# Patient Record
Sex: Female | Born: 1970 | Race: White | Hispanic: No | Marital: Married | Smoking: Never smoker
Health system: Southern US, Community
[De-identification: ages and names within clinical notes are randomized; demographics above are authoritative.]

## PROBLEM LIST (undated history)

## (undated) DIAGNOSIS — D497 Neoplasm of unspecified behavior of endocrine glands and other parts of nervous system: Secondary | ICD-10-CM

## (undated) DIAGNOSIS — E039 Hypothyroidism, unspecified: Secondary | ICD-10-CM

## (undated) DIAGNOSIS — G7000 Myasthenia gravis without (acute) exacerbation: Secondary | ICD-10-CM

---

## 2011-06-17 ENCOUNTER — Other Ambulatory Visit: Payer: Self-pay | Admitting: Neurology

## 2011-06-17 DIAGNOSIS — G7000 Myasthenia gravis without (acute) exacerbation: Secondary | ICD-10-CM

## 2011-06-25 ENCOUNTER — Other Ambulatory Visit: Payer: Self-pay | Admitting: Neurology

## 2011-06-25 DIAGNOSIS — I728 Aneurysm of other specified arteries: Secondary | ICD-10-CM

## 2011-07-03 ENCOUNTER — Inpatient Hospital Stay
Admission: RE | Admit: 2011-07-03 | Discharge: 2011-07-03 | Payer: Self-pay | Source: Ambulatory Visit | Attending: Neurology | Admitting: Neurology

## 2017-04-14 ENCOUNTER — Encounter: Payer: Self-pay | Admitting: Gastroenterology

## 2019-04-01 DIAGNOSIS — R600 Localized edema: Secondary | ICD-10-CM

## 2019-09-30 ENCOUNTER — Emergency Department (HOSPITAL_COMMUNITY): Payer: Commercial Managed Care - PPO

## 2019-09-30 ENCOUNTER — Inpatient Hospital Stay (HOSPITAL_COMMUNITY)
Admission: EM | Admit: 2019-09-30 | Discharge: 2019-10-15 | DRG: 177 | Disposition: E | Payer: Commercial Managed Care - PPO | Source: Ambulatory Visit | Attending: Internal Medicine | Admitting: Internal Medicine

## 2019-09-30 ENCOUNTER — Encounter (HOSPITAL_COMMUNITY): Payer: Self-pay

## 2019-09-30 ENCOUNTER — Inpatient Hospital Stay (HOSPITAL_COMMUNITY): Payer: Commercial Managed Care - PPO

## 2019-09-30 DIAGNOSIS — J80 Acute respiratory distress syndrome: Secondary | ICD-10-CM | POA: Diagnosis present

## 2019-09-30 DIAGNOSIS — R197 Diarrhea, unspecified: Secondary | ICD-10-CM | POA: Diagnosis present

## 2019-09-30 DIAGNOSIS — E890 Postprocedural hypothyroidism: Secondary | ICD-10-CM | POA: Diagnosis present

## 2019-09-30 DIAGNOSIS — Z87892 Personal history of anaphylaxis: Secondary | ICD-10-CM | POA: Diagnosis not present

## 2019-09-30 DIAGNOSIS — J1282 Pneumonia due to coronavirus disease 2019: Secondary | ICD-10-CM | POA: Diagnosis present

## 2019-09-30 DIAGNOSIS — L89899 Pressure ulcer of other site, unspecified stage: Secondary | ICD-10-CM | POA: Diagnosis not present

## 2019-09-30 DIAGNOSIS — T380X5A Adverse effect of glucocorticoids and synthetic analogues, initial encounter: Secondary | ICD-10-CM | POA: Diagnosis present

## 2019-09-30 DIAGNOSIS — K449 Diaphragmatic hernia without obstruction or gangrene: Secondary | ICD-10-CM | POA: Diagnosis present

## 2019-09-30 DIAGNOSIS — I959 Hypotension, unspecified: Secondary | ICD-10-CM | POA: Diagnosis present

## 2019-09-30 DIAGNOSIS — R7989 Other specified abnormal findings of blood chemistry: Secondary | ICD-10-CM | POA: Insufficient documentation

## 2019-09-30 DIAGNOSIS — Z66 Do not resuscitate: Secondary | ICD-10-CM | POA: Diagnosis not present

## 2019-09-30 DIAGNOSIS — I82452 Acute embolism and thrombosis of left peroneal vein: Secondary | ICD-10-CM | POA: Diagnosis present

## 2019-09-30 DIAGNOSIS — R040 Epistaxis: Secondary | ICD-10-CM | POA: Diagnosis present

## 2019-09-30 DIAGNOSIS — I10 Essential (primary) hypertension: Secondary | ICD-10-CM | POA: Diagnosis present

## 2019-09-30 DIAGNOSIS — Z7982 Long term (current) use of aspirin: Secondary | ICD-10-CM

## 2019-09-30 DIAGNOSIS — Z6841 Body Mass Index (BMI) 40.0 and over, adult: Secondary | ICD-10-CM | POA: Diagnosis not present

## 2019-09-30 DIAGNOSIS — J9383 Other pneumothorax: Secondary | ICD-10-CM | POA: Diagnosis not present

## 2019-09-30 DIAGNOSIS — E039 Hypothyroidism, unspecified: Secondary | ICD-10-CM | POA: Diagnosis not present

## 2019-09-30 DIAGNOSIS — R0902 Hypoxemia: Secondary | ICD-10-CM | POA: Diagnosis present

## 2019-09-30 DIAGNOSIS — M7989 Other specified soft tissue disorders: Secondary | ICD-10-CM | POA: Diagnosis not present

## 2019-09-30 DIAGNOSIS — Z515 Encounter for palliative care: Secondary | ICD-10-CM | POA: Diagnosis not present

## 2019-09-30 DIAGNOSIS — Z7189 Other specified counseling: Secondary | ICD-10-CM | POA: Diagnosis not present

## 2019-09-30 DIAGNOSIS — Z887 Allergy status to serum and vaccine status: Secondary | ICD-10-CM | POA: Diagnosis not present

## 2019-09-30 DIAGNOSIS — R609 Edema, unspecified: Secondary | ICD-10-CM | POA: Diagnosis not present

## 2019-09-30 DIAGNOSIS — Z85850 Personal history of malignant neoplasm of thyroid: Secondary | ICD-10-CM | POA: Diagnosis not present

## 2019-09-30 DIAGNOSIS — J9601 Acute respiratory failure with hypoxia: Secondary | ICD-10-CM | POA: Diagnosis not present

## 2019-09-30 DIAGNOSIS — I82441 Acute embolism and thrombosis of right tibial vein: Secondary | ICD-10-CM | POA: Diagnosis present

## 2019-09-30 DIAGNOSIS — R739 Hyperglycemia, unspecified: Secondary | ICD-10-CM | POA: Diagnosis not present

## 2019-09-30 DIAGNOSIS — Z79899 Other long term (current) drug therapy: Secondary | ICD-10-CM

## 2019-09-30 DIAGNOSIS — K219 Gastro-esophageal reflux disease without esophagitis: Secondary | ICD-10-CM | POA: Diagnosis present

## 2019-09-30 DIAGNOSIS — J982 Interstitial emphysema: Secondary | ICD-10-CM

## 2019-09-30 DIAGNOSIS — F419 Anxiety disorder, unspecified: Secondary | ICD-10-CM | POA: Diagnosis present

## 2019-09-30 DIAGNOSIS — E559 Vitamin D deficiency, unspecified: Secondary | ICD-10-CM | POA: Diagnosis present

## 2019-09-30 DIAGNOSIS — R6520 Severe sepsis without septic shock: Secondary | ICD-10-CM

## 2019-09-30 DIAGNOSIS — A419 Sepsis, unspecified organism: Secondary | ICD-10-CM | POA: Diagnosis not present

## 2019-09-30 DIAGNOSIS — K7200 Acute and subacute hepatic failure without coma: Secondary | ICD-10-CM | POA: Diagnosis not present

## 2019-09-30 DIAGNOSIS — R7303 Prediabetes: Secondary | ICD-10-CM | POA: Diagnosis present

## 2019-09-30 DIAGNOSIS — Z79890 Hormone replacement therapy (postmenopausal): Secondary | ICD-10-CM

## 2019-09-30 DIAGNOSIS — U071 COVID-19: Principal | ICD-10-CM

## 2019-09-30 DIAGNOSIS — I824Y3 Acute embolism and thrombosis of unspecified deep veins of proximal lower extremity, bilateral: Secondary | ICD-10-CM | POA: Diagnosis not present

## 2019-09-30 DIAGNOSIS — M79609 Pain in unspecified limb: Secondary | ICD-10-CM | POA: Diagnosis not present

## 2019-09-30 DIAGNOSIS — R7401 Elevation of levels of liver transaminase levels: Secondary | ICD-10-CM | POA: Diagnosis present

## 2019-09-30 DIAGNOSIS — G7000 Myasthenia gravis without (acute) exacerbation: Secondary | ICD-10-CM | POA: Diagnosis present

## 2019-09-30 DIAGNOSIS — I82403 Acute embolism and thrombosis of unspecified deep veins of lower extremity, bilateral: Secondary | ICD-10-CM

## 2019-09-30 DIAGNOSIS — R451 Restlessness and agitation: Secondary | ICD-10-CM | POA: Diagnosis not present

## 2019-09-30 HISTORY — DX: Myasthenia gravis without (acute) exacerbation: G70.00

## 2019-09-30 HISTORY — DX: Neoplasm of unspecified behavior of endocrine glands and other parts of nervous system: D49.7

## 2019-09-30 HISTORY — DX: Hypothyroidism, unspecified: E03.9

## 2019-09-30 LAB — CBG MONITORING, ED: Glucose-Capillary: 211 mg/dL — ABNORMAL HIGH (ref 70–99)

## 2019-09-30 LAB — LACTIC ACID, PLASMA
Lactic Acid, Venous: 1.1 mmol/L (ref 0.5–1.9)
Lactic Acid, Venous: 1.3 mmol/L (ref 0.5–1.9)

## 2019-09-30 LAB — CBC WITH DIFFERENTIAL/PLATELET
Abs Immature Granulocytes: 0.01 10*3/uL (ref 0.00–0.07)
Basophils Absolute: 0 10*3/uL (ref 0.0–0.1)
Basophils Relative: 0 %
Eosinophils Absolute: 0 10*3/uL (ref 0.0–0.5)
Eosinophils Relative: 0 %
HCT: 44.2 % (ref 36.0–46.0)
Hemoglobin: 14.3 g/dL (ref 12.0–15.0)
Immature Granulocytes: 0 %
Lymphocytes Relative: 19 %
Lymphs Abs: 0.5 10*3/uL — ABNORMAL LOW (ref 0.7–4.0)
MCH: 27.9 pg (ref 26.0–34.0)
MCHC: 32.4 g/dL (ref 30.0–36.0)
MCV: 86.2 fL (ref 80.0–100.0)
Monocytes Absolute: 0.2 10*3/uL (ref 0.1–1.0)
Monocytes Relative: 9 %
Neutro Abs: 2 10*3/uL (ref 1.7–7.7)
Neutrophils Relative %: 72 %
Platelets: 132 10*3/uL — ABNORMAL LOW (ref 150–400)
RBC: 5.13 MIL/uL — ABNORMAL HIGH (ref 3.87–5.11)
RDW: 13.5 % (ref 11.5–15.5)
WBC: 2.7 10*3/uL — ABNORMAL LOW (ref 4.0–10.5)
nRBC: 0 % (ref 0.0–0.2)

## 2019-09-30 LAB — COMPREHENSIVE METABOLIC PANEL
ALT: 172 U/L — ABNORMAL HIGH (ref 0–44)
AST: 235 U/L — ABNORMAL HIGH (ref 15–41)
Albumin: 3.1 g/dL — ABNORMAL LOW (ref 3.5–5.0)
Alkaline Phosphatase: 70 U/L (ref 38–126)
Anion gap: 10 (ref 5–15)
BUN: 7 mg/dL (ref 6–20)
CO2: 24 mmol/L (ref 22–32)
Calcium: 7.5 mg/dL — ABNORMAL LOW (ref 8.9–10.3)
Chloride: 104 mmol/L (ref 98–111)
Creatinine, Ser: 0.78 mg/dL (ref 0.44–1.00)
GFR calc Af Amer: >60 mL/min (ref >60)
GFR calc non Af Amer: >60 mL/min (ref >60)
Glucose, Bld: 138 mg/dL — ABNORMAL HIGH (ref 70–99)
Potassium: 4.1 mmol/L (ref 3.5–5.1)
Sodium: 138 mmol/L (ref 135–145)
Total Bilirubin: 0.6 mg/dL (ref 0.3–1.2)
Total Protein: 6.6 g/dL (ref 6.5–8.1)

## 2019-09-30 LAB — PROCALCITONIN: Procalcitonin: <0.1 ng/mL

## 2019-09-30 LAB — I-STAT BETA HCG BLOOD, ED (MC, WL, AP ONLY): I-stat hCG, quantitative: <5 m[IU]/mL (ref <5)

## 2019-09-30 LAB — SARS CORONAVIRUS 2 BY RT PCR (HOSPITAL ORDER, PERFORMED IN ~~LOC~~ HOSPITAL LAB): SARS Coronavirus 2: POSITIVE — AB

## 2019-09-30 LAB — HIV ANTIBODY (ROUTINE TESTING W REFLEX): HIV Screen 4th Generation wRfx: NONREACTIVE

## 2019-09-30 LAB — D-DIMER, QUANTITATIVE: D-Dimer, Quant: 0.7 ug/mL-FEU — ABNORMAL HIGH (ref 0.00–0.50)

## 2019-09-30 LAB — C-REACTIVE PROTEIN: CRP: 9.2 mg/dL — ABNORMAL HIGH (ref <1.0)

## 2019-09-30 LAB — FERRITIN: Ferritin: 844 ng/mL — ABNORMAL HIGH (ref 11–307)

## 2019-09-30 LAB — TRIGLYCERIDES: Triglycerides: 64 mg/dL (ref <150)

## 2019-09-30 LAB — LACTATE DEHYDROGENASE: LDH: 595 U/L — ABNORMAL HIGH (ref 98–192)

## 2019-09-30 LAB — FIBRINOGEN: Fibrinogen: 498 mg/dL — ABNORMAL HIGH (ref 210–475)

## 2019-09-30 MED ORDER — MONTELUKAST SODIUM 10 MG PO TABS
10.0000 mg | ORAL_TABLET | Freq: Every day | ORAL | Status: DC
  Administered 2019-09-30 – 2019-10-09 (×10): 10 mg via ORAL
  Filled 2019-09-30 (×10): qty 1

## 2019-09-30 MED ORDER — SODIUM CHLORIDE 0.9 % IV BOLUS (SEPSIS)
1000.0000 mL | Freq: Once | INTRAVENOUS | Status: AC
  Administered 2019-09-30: 1000 mL via INTRAVENOUS

## 2019-09-30 MED ORDER — PANTOPRAZOLE SODIUM 40 MG PO TBEC
40.0000 mg | DELAYED_RELEASE_TABLET | Freq: Every day | ORAL | Status: DC
  Administered 2019-09-30 – 2019-10-09 (×10): 40 mg via ORAL
  Filled 2019-09-30 (×10): qty 1

## 2019-09-30 MED ORDER — PYRIDOSTIGMINE BROMIDE 60 MG PO TABS
60.0000 mg | ORAL_TABLET | Freq: Four times a day (QID) | ORAL | Status: DC
  Administered 2019-09-30 – 2019-10-09 (×37): 60 mg via ORAL
  Filled 2019-09-30 (×41): qty 1

## 2019-09-30 MED ORDER — IPRATROPIUM-ALBUTEROL 20-100 MCG/ACT IN AERS
1.0000 | INHALATION_SPRAY | Freq: Four times a day (QID) | RESPIRATORY_TRACT | Status: DC
  Administered 2019-10-01 – 2019-10-10 (×36): 1 via RESPIRATORY_TRACT
  Filled 2019-09-30: qty 4

## 2019-09-30 MED ORDER — IOHEXOL 350 MG/ML SOLN
100.0000 mL | Freq: Once | INTRAVENOUS | Status: AC | PRN
  Administered 2019-09-30: 100 mL via INTRAVENOUS

## 2019-09-30 MED ORDER — PREDNISONE 20 MG PO TABS: 50.0000 mg | ORAL_TABLET | Freq: Every day | ORAL | Status: DC

## 2019-09-30 MED ORDER — SODIUM CHLORIDE 0.9 % IV SOLN
100.0000 mg | Freq: Every day | INTRAVENOUS | Status: AC
  Administered 2019-10-01 – 2019-10-04 (×4): 100 mg via INTRAVENOUS
  Filled 2019-09-30 (×5): qty 20

## 2019-09-30 MED ORDER — LEVOTHYROXINE SODIUM 75 MCG PO TABS
175.0000 ug | ORAL_TABLET | Freq: Every day | ORAL | Status: DC
  Administered 2019-10-01 – 2019-10-09 (×9): 175 ug via ORAL
  Filled 2019-09-30 (×9): qty 1

## 2019-09-30 MED ORDER — ACETAMINOPHEN 325 MG PO TABS
650.0000 mg | ORAL_TABLET | Freq: Four times a day (QID) | ORAL | Status: DC | PRN
  Administered 2019-10-02 – 2019-10-05 (×2): 650 mg via ORAL
  Filled 2019-09-30 (×3): qty 2

## 2019-09-30 MED ORDER — INSULIN ASPART 100 UNIT/ML ~~LOC~~ SOLN
0.0000 [IU] | Freq: Three times a day (TID) | SUBCUTANEOUS | Status: DC
  Administered 2019-10-01 (×2): 4 [IU] via SUBCUTANEOUS
  Administered 2019-10-01: 7 [IU] via SUBCUTANEOUS
  Administered 2019-10-02: 3 [IU] via SUBCUTANEOUS
  Administered 2019-10-02 – 2019-10-03 (×3): 4 [IU] via SUBCUTANEOUS
  Administered 2019-10-03: 7 [IU] via SUBCUTANEOUS
  Administered 2019-10-04: 3 [IU] via SUBCUTANEOUS
  Administered 2019-10-04: 7 [IU] via SUBCUTANEOUS
  Administered 2019-10-04 – 2019-10-05 (×3): 3 [IU] via SUBCUTANEOUS
  Administered 2019-10-06 – 2019-10-07 (×3): 4 [IU] via SUBCUTANEOUS
  Administered 2019-10-07 (×2): 7 [IU] via SUBCUTANEOUS
  Administered 2019-10-08 (×2): 4 [IU] via SUBCUTANEOUS
  Administered 2019-10-08: 7 [IU] via SUBCUTANEOUS
  Administered 2019-10-09: 3 [IU] via SUBCUTANEOUS
  Administered 2019-10-09: 4 [IU] via SUBCUTANEOUS
  Administered 2019-10-10: 3 [IU] via SUBCUTANEOUS
  Administered 2019-10-10: 0 [IU] via SUBCUTANEOUS
  Administered 2019-10-10: 4 [IU] via SUBCUTANEOUS
  Administered 2019-10-11: 3 [IU] via SUBCUTANEOUS
  Administered 2019-10-11: 4 [IU] via SUBCUTANEOUS
  Filled 2019-09-30: qty 0.2

## 2019-09-30 MED ORDER — ONDANSETRON HCL 4 MG PO TABS: 4.0000 mg | ORAL_TABLET | Freq: Four times a day (QID) | ORAL | Status: DC | PRN

## 2019-09-30 MED ORDER — SODIUM CHLORIDE 0.9 % IV SOLN
INTRAVENOUS | Status: DC | PRN
  Administered 2019-09-30: 1000 mL via INTRAVENOUS

## 2019-09-30 MED ORDER — INSULIN DETEMIR 100 UNIT/ML ~~LOC~~ SOLN
0.1500 [IU]/kg | Freq: Two times a day (BID) | SUBCUTANEOUS | Status: DC
  Administered 2019-09-30 – 2019-10-05 (×10): 20 [IU] via SUBCUTANEOUS
  Filled 2019-09-30 (×11): qty 0.2

## 2019-09-30 MED ORDER — SODIUM CHLORIDE 0.9 % IV SOLN
200.0000 mg | Freq: Once | INTRAVENOUS | Status: AC
  Administered 2019-09-30: 200 mg via INTRAVENOUS
  Filled 2019-09-30: qty 200

## 2019-09-30 MED ORDER — ACETAMINOPHEN 325 MG PO TABS
650.0000 mg | ORAL_TABLET | Freq: Once | ORAL | Status: AC
  Administered 2019-09-30: 650 mg via ORAL
  Filled 2019-09-30: qty 2

## 2019-09-30 MED ORDER — METHYLPREDNISOLONE SODIUM SUCC 125 MG IJ SOLR
60.0000 mg | Freq: Two times a day (BID) | INTRAMUSCULAR | Status: DC
  Administered 2019-09-30: 60 mg via INTRAVENOUS
  Filled 2019-09-30: qty 2

## 2019-09-30 MED ORDER — SODIUM CHLORIDE 0.9 % IV BOLUS (SEPSIS)
200.0000 mL | Freq: Once | INTRAVENOUS | Status: AC
  Administered 2019-09-30: 200 mL via INTRAVENOUS

## 2019-09-30 MED ORDER — OXYBUTYNIN CHLORIDE ER 5 MG PO TB24
10.0000 mg | ORAL_TABLET | Freq: Every day | ORAL | Status: DC
  Administered 2019-09-30 – 2019-10-09 (×10): 10 mg via ORAL
  Filled 2019-09-30 (×6): qty 2
  Filled 2019-09-30 (×2): qty 1
  Filled 2019-09-30 (×2): qty 2

## 2019-09-30 MED ORDER — ASPIRIN EC 81 MG PO TBEC
81.0000 mg | DELAYED_RELEASE_TABLET | Freq: Every day | ORAL | Status: DC
  Administered 2019-09-30 – 2019-10-09 (×10): 81 mg via ORAL
  Filled 2019-09-30 (×10): qty 1

## 2019-09-30 MED ORDER — ONDANSETRON HCL 4 MG/2ML IJ SOLN
4.0000 mg | Freq: Four times a day (QID) | INTRAMUSCULAR | Status: DC | PRN
  Administered 2019-10-02: 4 mg via INTRAVENOUS
  Filled 2019-09-30: qty 2

## 2019-09-30 MED ORDER — METHYLPREDNISOLONE SODIUM SUCC 125 MG IJ SOLR
0.5000 mg/kg | Freq: Two times a day (BID) | INTRAMUSCULAR | Status: AC
  Administered 2019-09-30 – 2019-10-03 (×6): 68.125 mg via INTRAVENOUS
  Filled 2019-09-30 (×6): qty 2

## 2019-09-30 MED ORDER — LINAGLIPTIN 5 MG PO TABS
5.0000 mg | ORAL_TABLET | Freq: Every day | ORAL | Status: DC
  Administered 2019-09-30 – 2019-10-09 (×10): 5 mg via ORAL
  Filled 2019-09-30 (×10): qty 1

## 2019-09-30 MED ORDER — ENOXAPARIN SODIUM 60 MG/0.6ML ~~LOC~~ SOLN
60.0000 mg | SUBCUTANEOUS | Status: DC
  Administered 2019-09-30 – 2019-10-04 (×4): 60 mg via SUBCUTANEOUS
  Filled 2019-09-30 (×6): qty 0.6

## 2019-09-30 MED ORDER — SODIUM CHLORIDE 0.9 % IV SOLN: 200.0000 mg | Freq: Once | INTRAVENOUS | Status: DC

## 2019-09-30 MED ORDER — SODIUM CHLORIDE 0.9 % IV SOLN: 100.0000 mg | Freq: Every day | INTRAVENOUS | Status: DC

## 2019-09-30 MED ORDER — LIOTHYRONINE SODIUM 5 MCG PO TABS
5.0000 ug | ORAL_TABLET | Freq: Every day | ORAL | Status: DC
  Administered 2019-09-30 – 2019-10-09 (×10): 5 ug via ORAL
  Filled 2019-09-30 (×11): qty 1

## 2019-09-30 MED ORDER — GUAIFENESIN-DM 100-10 MG/5ML PO SYRP
10.0000 mL | ORAL_SOLUTION | ORAL | Status: DC | PRN
  Administered 2019-10-01 – 2019-10-08 (×24): 10 mL via ORAL
  Filled 2019-09-30 (×23): qty 10

## 2019-09-30 MED ORDER — ENOXAPARIN SODIUM 60 MG/0.6ML ~~LOC~~ SOLN: 60.0000 mg | SUBCUTANEOUS | Status: DC

## 2019-09-30 NOTE — ED Notes (Signed)
Pt sitting up in bed eating dinner.

## 2019-09-30 NOTE — Progress Notes (Signed)
Code sepsis dc'ed  

## 2019-09-30 NOTE — ED Provider Notes (Addendum)
[facility] DEPT Provider Note   CSN: [id number] Arrival date & time: 09/26/2019  1122     History Chief Complaint  Patient presents with  . Shortness of Breath    Diane Flowers is a 49 y.o. female.  Patient brought over to the emergency department from the infusion center.  Patient was scheduled to receive monoclonal antibody infusion today.  Patient had a positive Covid test on September 10.  Started to develop symptoms on September 9.  Patient did not receive the monoclonal antibody because her oxygen sats were around 86% on room air.  Patient states that she has had cough developed the diarrhea today.  Last night her oxygen saturations went down to 83% room air.  This is the 1st that they have dropped.  Patient is also had increased shortness of breath.        No past medical history on file.  There are no problems to display for this patient.      OB History   No obstetric history on file.     No family history on file.  Social History   Tobacco Use  . Smoking status: Not on file  Substance Use Topics  . Alcohol use: Not on file  . Drug use: Not on file    Home Medications Prior to Admission medications   Not on File    Allergies    Patient has no allergy information on record.  Review of Systems   Review of Systems  Constitutional: Negative for chills and fever.  HENT: Negative for rhinorrhea and sore throat.   Eyes: Negative for visual disturbance.  Respiratory: Positive for cough and shortness of breath.   Cardiovascular: Negative for chest pain and leg swelling.  Gastrointestinal: Positive for diarrhea. Negative for abdominal pain, blood in stool, nausea and vomiting.  Genitourinary: Negative for dysuria.  Musculoskeletal: Negative for back pain and neck pain.  Skin: Negative for rash.  Neurological: Negative for dizziness, light-headedness and headaches.  Hematological: Does not bruise/bleed easily.    Psychiatric/Behavioral: Negative for confusion.    Physical Exam Updated Vital Signs BP (!) 147/107   Pulse 100   Temp 98.8 F (37.1 C) (Oral)   Resp (!) 25   SpO2 91%   Physical Exam Vitals and nursing note reviewed.  Constitutional:      General: She is not in acute distress.    Appearance: She is well-developed. She is obese.  HENT:     Head: Normocephalic and atraumatic.  Eyes:     Extraocular Movements: Extraocular movements intact.     Conjunctiva/sclera: Conjunctivae normal.     Pupils: Pupils are equal, round, and reactive to light.  Cardiovascular:     Rate and Rhythm: Regular rhythm. Tachycardia present.     Heart sounds: No murmur heard.   Pulmonary:     Effort: Respiratory distress present.     Breath sounds: Normal breath sounds. No wheezing.     Comments: Tachypneic Abdominal:     Palpations: Abdomen is soft.     Tenderness: There is no abdominal tenderness.  Musculoskeletal:        General: Normal range of motion.     Cervical back: Normal range of motion and neck supple.  Skin:    General: Skin is warm and dry.     Capillary Refill: Capillary refill takes less than 2 seconds.  Neurological:     General: No focal deficit present.     Mental Status: She is  alert and oriented to person, place, and time.     Cranial Nerves: No cranial nerve deficit.     Sensory: No sensory deficit.     Motor: No weakness.     ED Results / Procedures / Treatments   Labs (all labs ordered are listed, but only abnormal results are displayed) Labs Reviewed  CULTURE, BLOOD (ROUTINE X 2)  CULTURE, BLOOD (ROUTINE X 2)  LACTIC ACID, PLASMA  LACTIC ACID, PLASMA  CBC WITH DIFFERENTIAL/PLATELET  COMPREHENSIVE METABOLIC PANEL  D-DIMER, QUANTITATIVE (NOT AT [facility])  PROCALCITONIN  LACTATE DEHYDROGENASE  FERRITIN  TRIGLYCERIDES  FIBRINOGEN  C-REACTIVE PROTEIN  I-STAT BETA HCG BLOOD, ED ([facility], [facility], [facility] ONLY)    EKG EKG Interpretation  Date/Time:  Thursday September 30 2019 12:26:32 EDT Ventricular Rate:  99 PR Interval:    QRS Duration: 111 QT Interval:  347 QTC Calculation: 446 R Axis:   -82 Text Interpretation: Sinus rhythm LAD, consider left anterior fascicular block Anteroseptal infarct, age indeterminate 52 Lead; Mason-Likar No previous ECGs available Confirmed by Fredia Sorrow [phone]) on 10/04/2019 12:43:31 PM   Radiology DG Chest Port 1 View  Result Date: 10/13/2019 CLINICAL DATA:  COVID, hypoxia, shortness of breath EXAM: PORTABLE CHEST 1 VIEW COMPARISON:  None. FINDINGS: Heart upper limits normal in size. Mediastinal contours within normal limits. Patchy bilateral airspace opacities. No effusions or pneumothorax. No acute bony abnormality. IMPRESSION: Patchy bilateral airspace disease compatible with COVID pneumonia. Electronically Signed   By: Rolm Baptise M.D.   On: 10/05/2019 12:33    Procedures Procedures (including critical care time) CRITICAL CARE Performed by: Fredia Sorrow Total critical care time: 45 minutes Critical care time was exclusive of separately billable procedures and treating other patients. Critical care was necessary to treat or prevent imminent or life-threatening deterioration. Critical care was time spent personally by me on the following activities: development of treatment plan with patient and/or surrogate as well as nursing, discussions with consultants, evaluation of patient's response to treatment, examination of patient, obtaining history from patient or surrogate, ordering and performing treatments and interventions, ordering and review of laboratory studies, ordering and review of radiographic studies, pulse oximetry and re-evaluation of patient's condition.   Medications Ordered in ED Medications  methylPREDNISolone sodium succinate (SOLU-MEDROL) 125 mg/2 mL injection 60 mg (has no administration in time range)    ED Course  I have reviewed the triage vital signs and the nursing notes.  Pertinent  labs & imaging results that were available during my care of the patient were reviewed by me and considered in my medical decision making (see chart for details).    MDM Rules/Calculators/A&P                         Patient on 4 L of oxygen will desat down around 88%.  Bumped her up to 5 L which brought her to the low nineties but it would wax and wane sometimes as high as 97 other times down to 90.  Recommended respiratory therapy evaluated for high flow nasal cannula.  Chest x-ray consistent with multifocal pneumonia.  Patient's vital signs no fever.  Heart rate 10 1-1 03.  EKG showed sinus rhythm at 99 questionable left anterior fascicular block. Blood pressure is 139/82.  Patient will require admission.  For the Covid pneumonia with hypoxia.  Have ordered Solu-Medrol as well as remdesivir.  Patient did not receive monoclonal antibody she was due to receive it today.  Labs are still  pending.  With the exception CBC is back no anemia.  White blood cell count 2.7.  I-STAT hCG negative for pregnancy.  Patient's electrolytes without significant abnormalities.  Does have some liver function test abnormalities.  D-dimer is elevated at 0.7.  This may be just due to the Covid infection but since patient had significant change in oxygen saturations in the last 24 hours.  Have ordered CT angio chest chest to rule out pulmonary embolus.  I placed a call for the hospitalist for admission.  Final Clinical Impression(s) / ED Diagnoses Final diagnoses:  Pneumonia due to COVID-19 virus  Hypoxia    Rx / DC Orders ED Discharge Orders    None       Fredia Sorrow, MD 09/19/2019 1322    Fredia Sorrow, MD 09/21/2019 1332

## 2019-09-30 NOTE — H&P (Signed)
History and Physical        Hospital Admission Note Date: 10/10/2019  Patient name: Diane Flowers Medical record number: [id number] Date of birth: [birth year] Age: 49 y.o. Gender: female  PCP: Nodal, Alphonzo Dublin, PA-C  Patient coming from: home  At baseline, ambulates: independently  Chief Complaint    Chief Complaint  Patient presents with  . Shortness of Breath      HPI:   This is a 49 year old female who has not been vaccinated against COVID-19 with a history of anxiety, myasthenia gravis, pancreatic tumor, splenic artery aneurysm, thyroid cancer and acquired hypothyroidism (follows with endocrinology), prediabetes, surgical menopause who began having fever, chills, change in taste, shortness of breath, cough on 9/9 and tested positive for COVID-19 on 9/10.  She initially was to be set up for monoclonal antibody infusion this a.m. however began having hypoxic episodes last night on room air down to the 80s.  She presented to the infusion clinic this a.m. and was also noted to be in the 80s and was sent to the ED.  She did not receive the infusion.  She has since been requiring up to 6 L/min of O2.  ED Course: T-max 101F, HR 101, 25, BP fluctuating between hypertension and hypotension (lowest 79/62, currently 136/79), SPO2 92% on 6 L.  Notable labs: COVID-19 positive ED, glucose 138, AST 235, ALT 172, LDH 595, ferritin 844, CRP 9.2, lactic acid 1.3, negative procalcitonin, WBC 2.7, D-dimer 0.7, fibrinogen 498.  CTA chest ordered in the ED.  Started on remdesivir and Solu-Medrol.  Vitals:   09/29/2019 1454 10/07/2019 1515  BP:  136/79  Pulse:  87  Resp:  (!) 23  Temp: 100.2 F (37.9 C)   SpO2:  92%     Review of Systems:  Review of Systems  Constitutional: Positive for fever and malaise/fatigue.  Eyes: Negative.   Respiratory: Positive for cough, shortness of breath and  wheezing.   Cardiovascular: Negative.   Gastrointestinal: Positive for diarrhea and nausea. Negative for vomiting.  Genitourinary: Negative.   Musculoskeletal: Positive for myalgias.  Neurological: Negative.   Psychiatric/Behavioral: Negative.   All other systems reviewed and are negative.   Medical/Social/Family History   Past Medical History: History reviewed. No pertinent past medical history.  History reviewed. No pertinent surgical history.  Medications: Prior to Admission medications   Medication Sig Start Date End Date Taking? Authorizing Provider  acetaminophen (TYLENOL) 500 MG tablet Take 500 mg by mouth every 6 (six) hours as needed.   Yes [provider]  aspirin EC 81 MG tablet Take 81 mg by mouth daily. Swallow whole.   Yes [provider]  celecoxib (CELEBREX) 200 MG capsule Take 200 mg by mouth in the morning and at bedtime.   Yes [provider]  estradiol (VIVELLE-DOT) 0.0375 MG/24HR Place 1 patch onto the skin 2 (two) times a week. Sunday and Wednesday 04/29/18 04/07/20 Yes [provider]  levothyroxine (SYNTHROID) 175 MCG tablet Take 175 mcg by mouth daily before breakfast.   Yes [provider]  liothyronine (CYTOMEL) 5 MCG tablet Take 5 mcg by mouth daily.   Yes [provider]  montelukast (SINGULAIR) 10 MG tablet Take 10 mg by mouth  daily. 08/26/19  Yes [provider]  oxybutynin (DITROPAN-XL) 10 MG 24 hr tablet Take 10 mg by mouth daily. 07/29/19  Yes [provider]  pantoprazole (PROTONIX) 40 MG tablet Take 40 mg by mouth daily.  03/30/19  Yes [provider]  pyridostigmine (MESTINON) 60 MG tablet Take 60 mg by mouth 4 (four) times daily. 03/29/19  Yes [provider]  Semaglutide,0.25 or 0.5MG /DOS, (OZEMPIC, 0.25 OR 0.5 MG/DOSE,) 2 MG/1.5ML SOPN Inject 0.5 mg into the skin every Sunday. 08/09/19 11/07/19 Yes [provider]    Allergies:   Allergies  Allergen  Reactions  . Flu Virus Vaccine Anaphylaxis  . Sulfa Antibiotics Anaphylaxis  . Sulfamethoxazole Anaphylaxis  . Scopolamine Swelling    Social History:  reports that she has never smoked. She has never used smokeless tobacco. She reports that she does not drink alcohol and does not use drugs.  Family History: History reviewed. No pertinent family history.   Objective   Physical Exam: Blood pressure 136/79, pulse 87, temperature 100.2 F (37.9 C), temperature source Oral, resp. rate (!) 23, height 5\' 11"  (1.803 m), weight 136.1 kg, SpO2 92 %.  Physical Exam Vitals and nursing note reviewed.  Constitutional:      Appearance: Normal appearance. She is obese. She is ill-appearing.  HENT:     Head: Normocephalic and atraumatic.  Eyes:     Conjunctiva/sclera: Conjunctivae normal.  Cardiovascular:     Rate and Rhythm: Normal rate and regular rhythm.     Pulses: Normal pulses.  Pulmonary:     Effort: Pulmonary effort is normal. No tachypnea.     Breath sounds: No wheezing.  Abdominal:     General: Abdomen is flat.     Palpations: Abdomen is soft.  Musculoskeletal:        General: No swelling.     Right lower leg: Tenderness present. No edema.     Left lower leg: No edema.  Skin:    Coloration: Skin is not jaundiced or pale.  Neurological:     Mental Status: She is alert. Mental status is at baseline.  Psychiatric:        Mood and Affect: Mood normal.        Behavior: Behavior normal.     LABS on Admission: I have personally reviewed all the labs and imaging below    Basic Metabolic Panel: Recent Labs  Lab 10/11/2019 1237  NA 138  K 4.1  CL 104  CO2 24  GLUCOSE 138*  BUN 7  CREATININE 0.78  CALCIUM 7.5*   Liver Function Tests: Recent Labs  Lab 09/15/2019 1237  AST 235*  ALT 172*  ALKPHOS 70  BILITOT 0.6  PROT 6.6  ALBUMIN 3.1*   No results for input(s): LIPASE, AMYLASE in the last 168 hours. No results for input(s): AMMONIA in the last 168  hours. CBC: Recent Labs  Lab 09/24/2019 1237  WBC 2.7*  NEUTROABS 2.0  HGB 14.3  HCT 44.2  MCV 86.2  PLT 132*   Cardiac Enzymes: No results for input(s): CKTOTAL, CKMB, CKMBINDEX, TROPONINI in the last 168 hours. BNP: Invalid input(s): POCBNP CBG: No results for input(s): GLUCAP in the last 168 hours.  Radiological Exams on Admission:  CT Angio Chest PE W/Cm &/Or Wo Cm  Result Date: 09/16/2019 CLINICAL DATA:  COVID positive EXAM: CT ANGIOGRAPHY CHEST WITH CONTRAST TECHNIQUE: Multidetector CT imaging of the chest was performed using the standard protocol during bolus administration of intravenous contrast. Multiplanar CT image reconstructions  and MIPs were obtained to evaluate the vascular anatomy. CONTRAST:  16mL OMNIPAQUE IOHEXOL 350 MG/ML SOLN COMPARISON:  None. FINDINGS: Cardiovascular: Slightly suboptimal opacification is seen at the main pulmonary artery. However no central or proximal segmental pulmonary embolism is seen. The heart is normal in size. No pericardial effusion or thickening. No evidence right heart strain. There is normal three-vessel brachiocephalic anatomy without proximal stenosis. The thoracic aorta is normal in appearance. Mediastinum/Nodes: Scattered small subcarinal and pretracheal lymph nodes are seen. Thyroid gland, trachea, and esophagus demonstrate no significant findings. Lungs/Pleura: Multifocal patchy airspace opacities are seen throughout both lungs predominantly the right lung base. No pleural effusion is seen. No pneumothorax. Upper Abdomen: No acute abnormalities present in the visualized portions of the upper abdomen. Musculoskeletal: No chest wall abnormality. No acute or significant osseous findings. Review of the MIP images confirms the above findings. IMPRESSION: Slightly suboptimal opacification of the main pulmonary artery, however no central or proximal segmental pulmonary embolism. Extensive multifocal patchy airspace opacities throughout both lungs  consistent with multifocal pneumonia. Electronically Signed   By: Prudencio Pair M.D.   On: 10/07/2019 16:13   DG Chest Port 1 View  Result Date: 09/23/2019 CLINICAL DATA:  COVID, hypoxia, shortness of breath EXAM: PORTABLE CHEST 1 VIEW COMPARISON:  None. FINDINGS: Heart upper limits normal in size. Mediastinal contours within normal limits. Patchy bilateral airspace opacities. No effusions or pneumothorax. No acute bony abnormality. IMPRESSION: Patchy bilateral airspace disease compatible with COVID pneumonia. Electronically Signed   By: Rolm Baptise M.D.   On: 09/23/2019 12:33      EKG: Independently reviewed.  Left anterior fascicular block, no prior EKGs to compare to   A & P   Principal Problem:   Acute hypoxemic respiratory failure due to COVID-19 [facility]) Active Problems:   Sepsis (HCC)   Hypothyroid   Myasthenia gravis (HCC)   Positive D dimer   1. Sepsis without septic shock  Acute hypoxic respiratory failure secondary to COVID-19 a. Sepsis criteria: Febrile, tachycardic, tachypneic, leukopenia, elevated LFTs b. Continue remdesivir and monitor LFTs c. Continue Solu-Medrol with steroid-insulin order set d. Antitussives and inhalers e. Incentive spirometry and flutter valve f. Follow-up blood cultures g. Negative procalcitonin, hold off on antibiotics for now h. Follow-up CTA chest i. IV fluids per sepsis protocol  2. Elevated D-dimer with RLE tenderness to palpation a. Follow-up CTA chest b. will get a lower extremity ultrasound  3. History of myasthenia gravis a. Follows with neuro at [facility] b. Continue home pyridostigmine  4. History of thyroid cancer with acquired hypothyroidism a. Follows with [facility]ocrinology at [facility] b. Continue home levothyroxine and liothyroxine  5. Morbid Obesity  Prediabetes Body mass index is 41.84 kg/m. a. Hold home semaglutide b. Insulin per steroid order set as above   DVT prophylaxis: lovenox   Code Status: Partial  Code   Family Communication: Admission, patients condition and plan of care including tests being ordered have been discussed with the patient who indicates understanding and agrees with the plan and Code Status. Disposition Plan: The appropriate patient status for this patient is INPATIENT. Inpatient status is judged to be reasonable and necessary in order to provide the required intensity of service to ensure the patient's safety. The patient's presenting symptoms, physical exam findings, and initial radiographic and laboratory data in the context of their chronic comorbidities is felt to place them at high risk for further clinical deterioration. Furthermore, it is not anticipated that the patient will be medically stable for  discharge from the hospital within 2 midnights of admission. The following factors support the patient status of inpatient.   " The patient's presenting symptoms include shortness of breath, hypoxia. " The worrisome physical exam findings include hypoxia, cough. " The initial radiographic and laboratory data are worrisome because of multifocal pneumonia, positive COVID 19, elevated inflammatory markers. " The chronic co-morbidities include obesity, prediabetes, myasthenia gravis.   * I certify that at the point of admission it is my clinical judgment that the patient will require inpatient hospital care spanning beyond 2 midnights from the point of admission due to high intensity of service, high risk for further deterioration and high frequency of surveillance required.*   Status is: Inpatient  Remains inpatient appropriate because:Inpatient level of care appropriate due to severity of illness   Dispo: The patient is from: Home              Anticipated d/c is to: Home              Anticipated d/c date is: > 3 days              Patient currently is not medically stable to d/c.     Consultants  . none  Procedures  . none  Time Spent on Admission: 67 minutes     Harold Hedge, DO [facility] Pager [phone] 09/17/2019, 4:39 PM

## 2019-09-30 NOTE — ED Notes (Signed)
Patient o2sat still dropping to 88% on 4L. Increase oxygen to 5L.

## 2019-09-30 NOTE — ED Notes (Signed)
Pt o2 sat dropping to 86% on 2L. RN increase oxygen to 4L.

## 2019-09-30 NOTE — ED Triage Notes (Addendum)
Pt arrived with EMS from urgent care c/o SOB. EMS state pt o2sat was 86-88% on RA. Pt positive for covid last Monday 9/12.

## 2019-10-01 ENCOUNTER — Inpatient Hospital Stay (HOSPITAL_COMMUNITY): Payer: Commercial Managed Care - PPO

## 2019-10-01 ENCOUNTER — Other Ambulatory Visit: Payer: Self-pay

## 2019-10-01 DIAGNOSIS — M79609 Pain in unspecified limb: Secondary | ICD-10-CM

## 2019-10-01 DIAGNOSIS — R7989 Other specified abnormal findings of blood chemistry: Secondary | ICD-10-CM

## 2019-10-01 LAB — COMPREHENSIVE METABOLIC PANEL
ALT: 158 U/L — ABNORMAL HIGH (ref 0–44)
AST: 191 U/L — ABNORMAL HIGH (ref 15–41)
Albumin: 2.8 g/dL — ABNORMAL LOW (ref 3.5–5.0)
Alkaline Phosphatase: 60 U/L (ref 38–126)
Anion gap: 8 (ref 5–15)
BUN: 12 mg/dL (ref 6–20)
CO2: 22 mmol/L (ref 22–32)
Calcium: 7.4 mg/dL — ABNORMAL LOW (ref 8.9–10.3)
Chloride: 108 mmol/L (ref 98–111)
Creatinine, Ser: 0.73 mg/dL (ref 0.44–1.00)
GFR calc Af Amer: >60 mL/min (ref >60)
GFR calc non Af Amer: >60 mL/min (ref >60)
Glucose, Bld: 196 mg/dL — ABNORMAL HIGH (ref 70–99)
Potassium: 4.6 mmol/L (ref 3.5–5.1)
Sodium: 138 mmol/L (ref 135–145)
Total Bilirubin: 0.7 mg/dL (ref 0.3–1.2)
Total Protein: 6.1 g/dL — ABNORMAL LOW (ref 6.5–8.1)

## 2019-10-01 LAB — CBC WITH DIFFERENTIAL/PLATELET
Abs Immature Granulocytes: 0.01 10*3/uL (ref 0.00–0.07)
Basophils Absolute: 0 10*3/uL (ref 0.0–0.1)
Basophils Relative: 0 %
Eosinophils Absolute: 0 10*3/uL (ref 0.0–0.5)
Eosinophils Relative: 0 %
HCT: 40.7 % (ref 36.0–46.0)
Hemoglobin: 13.2 g/dL (ref 12.0–15.0)
Immature Granulocytes: 0 %
Lymphocytes Relative: 21 %
Lymphs Abs: 0.5 10*3/uL — ABNORMAL LOW (ref 0.7–4.0)
MCH: 27.8 pg (ref 26.0–34.0)
MCHC: 32.4 g/dL (ref 30.0–36.0)
MCV: 85.7 fL (ref 80.0–100.0)
Monocytes Absolute: 0.2 10*3/uL (ref 0.1–1.0)
Monocytes Relative: 7 %
Neutro Abs: 1.6 10*3/uL — ABNORMAL LOW (ref 1.7–7.7)
Neutrophils Relative %: 72 %
Platelets: 124 10*3/uL — ABNORMAL LOW (ref 150–400)
RBC: 4.75 MIL/uL (ref 3.87–5.11)
RDW: 13.7 % (ref 11.5–15.5)
WBC: 2.3 10*3/uL — ABNORMAL LOW (ref 4.0–10.5)
nRBC: 0 % (ref 0.0–0.2)

## 2019-10-01 LAB — GLUCOSE, CAPILLARY
Glucose-Capillary: 199 mg/dL — ABNORMAL HIGH (ref 70–99)
Glucose-Capillary: 211 mg/dL — ABNORMAL HIGH (ref 70–99)

## 2019-10-01 LAB — CBG MONITORING, ED
Glucose-Capillary: 178 mg/dL — ABNORMAL HIGH (ref 70–99)
Glucose-Capillary: 249 mg/dL — ABNORMAL HIGH (ref 70–99)

## 2019-10-01 LAB — FERRITIN: Ferritin: 777 ng/mL — ABNORMAL HIGH (ref 11–307)

## 2019-10-01 LAB — MAGNESIUM: Magnesium: 2.1 mg/dL (ref 1.7–2.4)

## 2019-10-01 LAB — D-DIMER, QUANTITATIVE: D-Dimer, Quant: 0.7 ug/mL-FEU — ABNORMAL HIGH (ref 0.00–0.50)

## 2019-10-01 LAB — MRSA PCR SCREENING: MRSA by PCR: NEGATIVE

## 2019-10-01 LAB — C-REACTIVE PROTEIN: CRP: 9.1 mg/dL — ABNORMAL HIGH (ref <1.0)

## 2019-10-01 MED ORDER — ORAL CARE MOUTH RINSE
15.0000 mL | Freq: Two times a day (BID) | OROMUCOSAL | Status: DC
  Administered 2019-10-01 – 2019-10-08 (×14): 15 mL via OROMUCOSAL

## 2019-10-01 MED ORDER — LOPERAMIDE HCL 2 MG PO CAPS
2.0000 mg | ORAL_CAPSULE | ORAL | Status: DC | PRN
  Administered 2019-10-01 – 2019-10-09 (×7): 2 mg via ORAL
  Filled 2019-10-01 (×7): qty 1

## 2019-10-01 MED ORDER — BARICITINIB 2 MG PO TABS
4.0000 mg | ORAL_TABLET | Freq: Every day | ORAL | Status: DC
  Administered 2019-10-01 – 2019-10-09 (×9): 4 mg via ORAL
  Filled 2019-10-01 (×9): qty 2

## 2019-10-01 MED ORDER — HYDROCOD POLST-CPM POLST ER 10-8 MG/5ML PO SUER
5.0000 mL | Freq: Once | ORAL | Status: AC
  Administered 2019-10-01: 5 mL via ORAL
  Filled 2019-10-01: qty 5

## 2019-10-01 NOTE — Progress Notes (Signed)
PROGRESS NOTE    Diane Flowers  DGU:[id number] DOB: April 23, 1970 DOA: 10/08/2019 PCP: Maggie Schwalbe, PA-C    Brief Narrative:  49 year old female, unvaccinated against COVID-19 with history of anxiety, myasthenia gravis, pancreatic tumor, thyroid cancer and hypothyroidism symptomatic since 9/9 tested positive for COVID-19 on 9/10.  Was at infusion clinic for monoclonal antibody however found to be hypoxic to 80% on room air and was sent to ER. In the emergency room, febrile temperature 101, heart rate 101, blood pressures 79/62, 92% on 6 L.  Mild elevated AST and ALT.  CTA chest with no pulmonary embolism but extensive bilateral pneumonia.   Assessment & Plan:   Principal Problem:   Acute hypoxemic respiratory failure due to COVID-19 [facility]) Active Problems:   Sepsis ([place])   Hypothyroid   Myasthenia gravis ([place])   Positive D dimer  Acute hypoxemic respiratory to COVID-19 virus infection:  Sepsis present on admission.  Improving. Continue to monitor due to significant symptoms  chest physiotherapy, incentive spirometry, deep breathing exercises, sputum induction, mucolytic's and bronchodilators. Supplemental oxygen to keep saturations more than 85%. Covid directed therapy with , steroids, high-dose Solu-Medrol remdesivir, day 2/5 Baricitinib, we discussed about different modalities to treat current pandemic of COVID-19.  Especially with her increasing oxygen requirement and high morbidity and mortality, she will benefit with use of medication like baricitinib along with remdesivir that has improved survival.  Patient does not have obvious contraindication to use this medication.  She has consented.  Will start on baricitinib 4 mg daily today. antibiotics, not indicated Due to severity of symptoms, patient will need daily inflammatory markers, chest x-rays, liver function test to monitor and direct COVID-19 therapies.  COVID-19 Labs  Recent Labs    09/22/2019 1237 10/01/19 0615    DDIMER 0.70* 0.70*  FERRITIN 844* 777*  LDH 595*  --   CRP 9.2* 9.1*    Lab Results  Component Value Date   SARSCOV2NAA POSITIVE (A) 10/03/2019   SpO2: (!) 88 % O2 Flow Rate (L/min): 15 L/min  Prediabetes/morbid obesity: On semaglutide at home.  Keeping on insulin on high-dose steroids.  Blood sugars fair.  Myasthenia gravis: On pyridostigmine that she will continue.  Hypothyroidism: On replacement with levothyroxine and liothyronine that she will continue.  Abnormal liver function tests: Consistent with acute viral infection.  Levels are stabilizing. Hepatic Function Latest Ref Rng & Units 10/01/2019 10/10/2019  Total Protein 6.5 - 8.1 g/dL 6.1(L) 6.6  Albumin 3.5 - 5.0 g/dL 2.8(L) 3.1(L)  AST 15 - 41 U/L 191(H) 235(H)  ALT 0 - 44 U/L 158(H) 172(H)  Alk Phosphatase 38 - 126 U/L 60 70  Total Bilirubin 0.3 - 1.2 mg/dL 0.7 0.6     DVT prophylaxis: Lovenox subcu   Code Status: Full code except no mechanical intubation Family Communication: Husband on the phone Disposition Plan: Status is: Inpatient  Remains inpatient appropriate because:Inpatient level of care appropriate due to severity of illness   Dispo: The patient is from: Home              Anticipated d/c is to: Home              Anticipated d/c date is: > 3 days              Patient currently is not medically stable to d/c.         Consultants:   None  Procedures:   None  Antimicrobials:  Antibiotics Given (last 72 hours)    Date/Time  Action Medication Dose Rate   10/13/2019 1448 New Bag/Given   remdesivir 200 mg in sodium chloride 0.9% 250 mL IVPB 200 mg 580 mL/hr         Subjective: Patient was seen and examined.  Continues to have difficulty breathing.  Early morning, she felt more short of breath even at rest, has to go up on oxygen to 15 L.  Able to keep up conversation.  Denies any nausea vomiting.  Objective: Vitals:   10/01/19 1301 10/01/19 1302 10/01/19 1302 10/01/19 1421  BP:     129/74  Pulse: 75 75  76  Resp: (!) 22 (!) 26  (!) 26  Temp:   99 F (37.2 C) 98.4 F (36.9 C)  TempSrc:   Oral Oral  SpO2: 90% 90%  (!) 88%  Weight:      Height:        Intake/Output Summary (Last 24 hours) at 10/01/2019 1429 Last data filed at 09/18/2019 1816 Gross per 24 hour  Intake 2408.48 ml  Output --  Net 2408.48 ml   Filed Weights   09/18/2019 1335  Weight: 136.1 kg    Examination:  General exam: Appears in mild respiratory distress and anxious.  On 15 L oxygen. Respiratory system: Clear to auscultation. Respiratory effort normal.  No added sounds. Cardiovascular system: S1 & S2 heard, RRR. No JVD, murmurs, rubs, gallops or clicks. No pedal edema. Gastrointestinal system: Abdomen is nondistended, soft and nontender. No organomegaly or masses felt. Normal bowel sounds heard.  Obese and pendulous. Central nervous system: Alert and oriented. No focal neurological deficits. Extremities: Symmetric 5 x 5 power. Skin: No rashes, lesions or ulcers Psychiatry: Judgement and insight appear normal. Mood & affect appropriate.     Data Reviewed: I have personally reviewed following labs and imaging studies  CBC: Recent Labs  Lab 10/13/2019 1237 10/01/19 0615  WBC 2.7* 2.3*  NEUTROABS 2.0 1.6*  HGB 14.3 13.2  HCT 44.2 40.7  MCV 86.2 85.7  PLT 132* 597*   Basic Metabolic Panel: Recent Labs  Lab 10/04/2019 1237 10/01/19 0615  NA 138 138  K 4.1 4.6  CL 104 108  CO2 24 22  GLUCOSE 138* 196*  BUN 7 12  CREATININE 0.78 0.73  CALCIUM 7.5* 7.4*  MG  --  2.1   GFR: Estimated Creatinine Clearance: 131.6 mL/min (by C-G formula based on SCr of 0.73 mg/dL). Liver Function Tests: Recent Labs  Lab 10/09/2019 1237 10/01/19 0615  AST 235* 191*  ALT 172* 158*  ALKPHOS 70 60  BILITOT 0.6 0.7  PROT 6.6 6.1*  ALBUMIN 3.1* 2.8*   No results for input(s): LIPASE, AMYLASE in the last 168 hours. No results for input(s): AMMONIA in the last 168 hours. Coagulation Profile: No  results for input(s): INR, PROTIME in the last 168 hours. Cardiac Enzymes: No results for input(s): CKTOTAL, CKMB, CKMBINDEX, TROPONINI in the last 168 hours. BNP (last 3 results) No results for input(s): PROBNP in the last 8760 hours. HbA1C: No results for input(s): HGBA1C in the last 72 hours. CBG: Recent Labs  Lab 10/08/2019 2228 10/01/19 0740 10/01/19 1129  GLUCAP 211* 178* 249*   Lipid Profile: Recent Labs    09/27/2019 1237  TRIG 64   Thyroid Function Tests: No results for input(s): TSH, T4TOTAL, FREET4, T3FREE, THYROIDAB in the last 72 hours. Anemia Panel: Recent Labs    09/29/2019 1237 10/01/19 0615  FERRITIN 844* 777*   Sepsis Labs: Recent Labs  Lab 10/11/2019 1214 10/05/2019 1237  10/07/2019 1437  PROCALCITON  --  <0.10  --   LATICACIDVEN 1.1  --  1.3    Recent Results (from the past 240 hour(s))  Blood Culture (routine x 2)     Status: None (Preliminary result)   Collection Time: 10/11/2019 12:14 PM   Specimen: BLOOD  Result Value Ref Range Status   Specimen Description   Final    BLOOD LEFT ANTECUBITAL Performed at [facility] [address], [place], [place] [address]    Special Requests   Final    BOTTLES DRAWN AEROBIC AND ANAEROBIC Blood Culture adequate volume Performed at [facility] [address], [place], [place] [address]    Culture   Final    NO GROWTH < 24 HOURS Performed at [facility] [address], [place], [place] [address]    Report Status PENDING  Incomplete  Blood Culture (routine x 2)     Status: None (Preliminary result)   Collection Time: 09/29/2019  1:56 PM   Specimen: BLOOD  Result Value Ref Range Status   Specimen Description BLOOD SITE NOT SPECIFIED  Final   Special Requests   Final    BOTTLES DRAWN AEROBIC AND ANAEROBIC Blood Culture results may not be optimal due to an inadequate volume of blood received in culture bottles   Culture   Final    NO GROWTH < 24 HOURS Performed at  [facility], [place] [address], [place], [place] [address]    Report Status PENDING  Incomplete  SARS Coronavirus 2 by RT PCR (hospital order, performed in [facility] hospital lab) Nasopharyngeal Nasopharyngeal Swab     Status: Abnormal   Collection Time: 10/08/2019  2:01 PM   Specimen: Nasopharyngeal Swab  Result Value Ref Range Status   SARS Coronavirus 2 POSITIVE (A) NEGATIVE Final    Comment: RESULT CALLED TO, READ BACK BY AND VERIFIED WITH: WEST,K. RN $[email or web address]'[id number]$  ON 09.16.2021 BY COHEN,K Performed at [facility], [place] [address] [place], [place] [address]          Radiology Studies: CT Angio Chest PE W/Cm &/Or Wo Cm  Result Date: 10/07/2019 CLINICAL DATA:  COVID positive EXAM: CT ANGIOGRAPHY CHEST WITH CONTRAST TECHNIQUE: Multidetector CT imaging of the chest was performed using the standard protocol during bolus administration of intravenous contrast. Multiplanar CT image reconstructions and MIPs were obtained to evaluate the vascular anatomy. CONTRAST:  14mL OMNIPAQUE IOHEXOL 350 MG/ML SOLN COMPARISON:  None. FINDINGS: Cardiovascular: Slightly suboptimal opacification is seen at the main pulmonary artery. However no central or proximal segmental pulmonary embolism is seen. The heart is normal in size. No pericardial effusion or thickening. No evidence right heart strain. There is normal three-vessel brachiocephalic anatomy without proximal stenosis. The thoracic aorta is normal in appearance. Mediastinum/Nodes: Scattered small subcarinal and pretracheal lymph nodes are seen. Thyroid gland, trachea, and esophagus demonstrate no significant findings. Lungs/Pleura: Multifocal patchy airspace opacities are seen throughout both lungs predominantly the right lung base. No pleural effusion is seen. No pneumothorax. Upper Abdomen: No acute abnormalities present in the visualized portions of the upper abdomen. Musculoskeletal: No chest wall abnormality. No acute or significant  osseous findings. Review of the MIP images confirms the above findings. IMPRESSION: Slightly suboptimal opacification of the main pulmonary artery, however no central or proximal segmental pulmonary embolism. Extensive multifocal patchy airspace opacities throughout both lungs consistent with multifocal pneumonia. Electronically Signed   By: Prudencio Pair M.D.   On: 10/02/2019 16:13  DG Chest Port 1 View  Result Date: 10/08/2019 CLINICAL DATA:  COVID, hypoxia, shortness of breath EXAM: PORTABLE CHEST 1 VIEW COMPARISON:  None. FINDINGS: Heart upper limits normal in size. Mediastinal contours within normal limits. Patchy bilateral airspace opacities. No effusions or pneumothorax. No acute bony abnormality. IMPRESSION: Patchy bilateral airspace disease compatible with COVID pneumonia. Electronically Signed   By: Rolm Baptise M.D.   On: 10/10/2019 12:33   VAS [place] LOWER EXTREMITY VENOUS (DVT)  Result Date: 10/01/2019  Lower Venous DVTStudy Indications: Pain, and elevated ddimer.  Comparison Study: no prior Performing Technologist: Abram Sander RVS  Examination Guidelines: A complete evaluation includes B-mode imaging, spectral Doppler, color Doppler, and power Doppler as needed of all accessible portions of each vessel. Bilateral testing is considered an integral part of a complete examination. Limited examinations for reoccurring indications may be performed as noted. The reflux portion of the exam is performed with the patient in reverse Trendelenburg.  +---------+---------------+---------+-----------+----------+--------------+  RIGHT     Compressibility Phasicity Spontaneity Properties Thrombus Aging  +---------+---------------+---------+-----------+----------+--------------+  CFV       Full            Yes       Yes                                    +---------+---------------+---------+-----------+----------+--------------+  SFJ       Full                                                              +---------+---------------+---------+-----------+----------+--------------+  FV Prox   Full                                                             +---------+---------------+---------+-----------+----------+--------------+  FV Mid    Full                                                             +---------+---------------+---------+-----------+----------+--------------+  FV Distal Full                                                             +---------+---------------+---------+-----------+----------+--------------+  PFV       Full                                                             +---------+---------------+---------+-----------+----------+--------------+  POP       Full  Yes       Yes                                    +---------+---------------+---------+-----------+----------+--------------+  PTV       Full                                                             +---------+---------------+---------+-----------+----------+--------------+  PERO      Full                                                             +---------+---------------+---------+-----------+----------+--------------+   +---------+---------------+---------+-----------+----------+--------------+  LEFT      Compressibility Phasicity Spontaneity Properties Thrombus Aging  +---------+---------------+---------+-----------+----------+--------------+  CFV       Full            Yes       Yes                                    +---------+---------------+---------+-----------+----------+--------------+  SFJ       Full                                                             +---------+---------------+---------+-----------+----------+--------------+  FV Prox   Full                                                             +---------+---------------+---------+-----------+----------+--------------+  FV Mid    Full                                                              +---------+---------------+---------+-----------+----------+--------------+  FV Distal Full                                                             +---------+---------------+---------+-----------+----------+--------------+  PFV       Full                                                             +---------+---------------+---------+-----------+----------+--------------+  POP       Full            Yes       Yes                                    +---------+---------------+---------+-----------+----------+--------------+  PTV       Full                                                             +---------+---------------+---------+-----------+----------+--------------+  PERO                                                       Not visualized  +---------+---------------+---------+-----------+----------+--------------+     Summary: BILATERAL: - No evidence of deep vein thrombosis seen in the lower extremities, bilaterally. - No evidence of superficial venous thrombosis in the lower extremities, bilaterally. -   *See table(s) above for measurements and observations. Electronically signed by Servando Snare MD on 10/01/2019 at 9:21:46 AM.    Final         Scheduled Meds:  aspirin EC  81 mg Oral Daily   baricitinib  4 mg Oral Daily   enoxaparin (LOVENOX) injection  60 mg Subcutaneous Q24H   insulin aspart  0-20 Units Subcutaneous TID WC   insulin detemir  0.15 Units/kg Subcutaneous BID   Ipratropium-Albuterol  1 puff Inhalation Q6H   levothyroxine  175 mcg Oral QAC breakfast   linagliptin  5 mg Oral Daily   liothyronine  5 mcg Oral Daily   methylPREDNISolone (SOLU-MEDROL) injection  0.5 mg/kg Intravenous Q12H   Followed by   Derrill Memo ON 10/04/2019] predniSONE  50 mg Oral QPC breakfast   montelukast  10 mg Oral Daily   oxybutynin  10 mg Oral Daily   pantoprazole  40 mg Oral Daily   pyridostigmine  60 mg Oral QID   Continuous Infusions:  sodium chloride Stopped (09/27/2019 2242)    remdesivir 100 mg in NS 100 mL 100 mg (10/01/19 0932)     LOS: 1 day    Time spent: 35 minutes    Barb Merino, MD [facility] Pager [phone]

## 2019-10-01 NOTE — ED Notes (Signed)
Patient O2 saturation improved to 94% after being waking up and talking

## 2019-10-01 NOTE — Progress Notes (Signed)
Lower extremity venous has been completed.   Preliminary results in CV Proc.   Abram Sander 10/01/2019 8:44 AM

## 2019-10-01 NOTE — Progress Notes (Signed)
Inpatient Diabetes Program Recommendations  AACE/ADA: New Consensus Statement on Inpatient Glycemic Control (2015)  Target Ranges:  Prepandial:   less than 140 mg/dL      Peak postprandial:   less than 180 mg/dL (1-2 hours)      Critically ill patients:  140 - 180 mg/dL   Lab Results  Component Value Date   GLUCAP 249 (H) 10/01/2019    Review of Glycemic Control Results for Diane Flowers, Diane Flowers (MRN [id number]) as of 10/01/2019 12:14  Ref. Range 10/01/2019 07:40 10/01/2019 11:29  Glucose-Capillary Latest Ref Range: 70 - 99 mg/dL 178 (H) 249 (H)   Diabetes history: Pre DM, pancreatic tumor in past Outpatient Diabetes medications: Ozempic 0.5 mg QSunday Current orders for Inpatient glycemic control:  Levemir 20 units bid Novolog 0-20 units tid Tradjenta 5 mg Daily  Solumedrol 68.125 mg Q12 hours  Inpatient Diabetes Program Recommendations:    Second Levemir dose given this am fasting 178.   - Consider adding Novolog 4 units tid meal coverage.  Thanks,  Tama Headings RN, MSN, BC-ADM Inpatient Diabetes Coordinator Team Pager [phone] (8a-5p)

## 2019-10-01 NOTE — ED Notes (Signed)
Per Respiratory: Pt placed on 10L [place] humidified High Flow.Pts sat on 10L high flow 93%.  Pts sat on the 6L [place] was 85%. Hospitalist at bedside

## 2019-10-02 LAB — CBC WITH DIFFERENTIAL/PLATELET
Abs Immature Granulocytes: 0.02 10*3/uL (ref 0.00–0.07)
Basophils Absolute: 0 10*3/uL (ref 0.0–0.1)
Basophils Relative: 0 %
Eosinophils Absolute: 0 10*3/uL (ref 0.0–0.5)
Eosinophils Relative: 0 %
HCT: 40.5 % (ref 36.0–46.0)
Hemoglobin: 12.9 g/dL (ref 12.0–15.0)
Immature Granulocytes: 1 %
Lymphocytes Relative: 20 %
Lymphs Abs: 0.7 10*3/uL (ref 0.7–4.0)
MCH: 27.4 pg (ref 26.0–34.0)
MCHC: 31.9 g/dL (ref 30.0–36.0)
MCV: 86.2 fL (ref 80.0–100.0)
Monocytes Absolute: 0.3 10*3/uL (ref 0.1–1.0)
Monocytes Relative: 9 %
Neutro Abs: 2.3 10*3/uL (ref 1.7–7.7)
Neutrophils Relative %: 70 %
Platelets: 119 10*3/uL — ABNORMAL LOW (ref 150–400)
RBC: 4.7 MIL/uL (ref 3.87–5.11)
RDW: 13.6 % (ref 11.5–15.5)
WBC: 3.2 10*3/uL — ABNORMAL LOW (ref 4.0–10.5)
nRBC: 0 % (ref 0.0–0.2)

## 2019-10-02 LAB — COMPREHENSIVE METABOLIC PANEL
ALT: 122 U/L — ABNORMAL HIGH (ref 0–44)
AST: 136 U/L — ABNORMAL HIGH (ref 15–41)
Albumin: 2.6 g/dL — ABNORMAL LOW (ref 3.5–5.0)
Alkaline Phosphatase: 58 U/L (ref 38–126)
Anion gap: 9 (ref 5–15)
BUN: 17 mg/dL (ref 6–20)
CO2: 21 mmol/L — ABNORMAL LOW (ref 22–32)
Calcium: 7.7 mg/dL — ABNORMAL LOW (ref 8.9–10.3)
Chloride: 107 mmol/L (ref 98–111)
Creatinine, Ser: 0.74 mg/dL (ref 0.44–1.00)
GFR calc Af Amer: >60 mL/min (ref >60)
GFR calc non Af Amer: >60 mL/min (ref >60)
Glucose, Bld: 221 mg/dL — ABNORMAL HIGH (ref 70–99)
Potassium: 4.5 mmol/L (ref 3.5–5.1)
Sodium: 137 mmol/L (ref 135–145)
Total Bilirubin: 0.5 mg/dL (ref 0.3–1.2)
Total Protein: 5.9 g/dL — ABNORMAL LOW (ref 6.5–8.1)

## 2019-10-02 LAB — GLUCOSE, CAPILLARY
Glucose-Capillary: 194 mg/dL — ABNORMAL HIGH (ref 70–99)
Glucose-Capillary: 187 mg/dL — ABNORMAL HIGH (ref 70–99)
Glucose-Capillary: 139 mg/dL — ABNORMAL HIGH (ref 70–99)
Glucose-Capillary: 172 mg/dL — ABNORMAL HIGH (ref 70–99)

## 2019-10-02 LAB — D-DIMER, QUANTITATIVE: D-Dimer, Quant: 0.82 ug/mL-FEU — ABNORMAL HIGH (ref 0.00–0.50)

## 2019-10-02 LAB — FERRITIN: Ferritin: 616 ng/mL — ABNORMAL HIGH (ref 11–307)

## 2019-10-02 LAB — MAGNESIUM: Magnesium: 2.2 mg/dL (ref 1.7–2.4)

## 2019-10-02 LAB — C-REACTIVE PROTEIN: CRP: 3.9 mg/dL — ABNORMAL HIGH (ref <1.0)

## 2019-10-02 MED ORDER — INSULIN ASPART 100 UNIT/ML ~~LOC~~ SOLN
4.0000 [IU] | Freq: Three times a day (TID) | SUBCUTANEOUS | Status: DC
  Administered 2019-10-02 – 2019-10-04 (×7): 4 [IU] via SUBCUTANEOUS

## 2019-10-02 MED ORDER — FUROSEMIDE 10 MG/ML IJ SOLN
40.0000 mg | Freq: Once | INTRAMUSCULAR | Status: AC
  Administered 2019-10-02: 40 mg via INTRAVENOUS
  Filled 2019-10-02: qty 4

## 2019-10-02 MED ORDER — CHLORHEXIDINE GLUCONATE CLOTH 2 % EX PADS
6.0000 | MEDICATED_PAD | Freq: Every day | CUTANEOUS | Status: DC
  Administered 2019-10-02 – 2019-10-11 (×9): 6 via TOPICAL

## 2019-10-02 MED ORDER — SIMETHICONE 80 MG PO CHEW
160.0000 mg | CHEWABLE_TABLET | Freq: Once | ORAL | Status: AC
  Administered 2019-10-02: 160 mg via ORAL
  Filled 2019-10-02: qty 2

## 2019-10-02 NOTE — Progress Notes (Signed)
PROGRESS NOTE    Diane Flowers  VZD:[id number] DOB: 13-Dec-1970 DOA: 10/14/2019 PCP: Maggie Schwalbe, PA-C    Brief Narrative:  49 year old female, unvaccinated against COVID-19 with history of anxiety, myasthenia gravis, pancreatic tumor, thyroid cancer and hypothyroidism symptomatic since 9/9 tested positive for COVID-19 on 9/10.  Was at infusion clinic for monoclonal antibody however found to be hypoxic to 80% on room air and was sent to ER. In the emergency room, febrile temperature 101, heart rate 101, blood pressures 79/62, 92% on 6 L.  Mild elevated AST and ALT.  CTA chest with no pulmonary embolism but extensive bilateral pneumonia. 9/18, patient had increased oxygen requirement.   Assessment & Plan:   Principal Problem:   Acute hypoxemic respiratory failure due to COVID-19 [facility]) Active Problems:   Sepsis ([place])   Hypothyroid   Myasthenia gravis ([place])   Positive D dimer  Acute hypoxemic respiratory to COVID-19 virus infection:  Sepsis present on admission.  Improving. Continue to monitor due to significant symptoms, on very high oxygen requirement. chest physiotherapy, incentive spirometry, deep breathing exercises, sputum induction, mucolytic's and bronchodilators. Supplemental oxygen to keep saturations more than 85%. Covid directed therapy with , steroids, high-dose Solu-Medrol remdesivir, day 3/5 Baricitinib, we discussed about different modalities to treat current pandemic of COVID-19.  Especially with her increasing oxygen requirement and high morbidity and mortality, she will benefit with use of medication like baricitinib along with remdesivir that has improved survival.  Patient does not have obvious contraindication to use this medication.  She has consented.  Will start on baricitinib 4 mg.  Day 2/14 or until hospital discharge. antibiotics, not indicated Due to severity of symptoms, patient will need daily inflammatory markers, chest x-rays, liver function test to  monitor and direct COVID-19 therapies.  COVID-19 Labs  Recent Labs    10/08/2019 1237 10/01/19 0615 10/02/19 0243  DDIMER 0.70* 0.70* 0.82*  FERRITIN 844* 777* 616*  LDH 595*  --   --   CRP 9.2* 9.1* 3.9*    Lab Results  Component Value Date   SARSCOV2NAA POSITIVE (A) 10/08/2019   SpO2: 91 % O2 Flow Rate (L/min): 30 L/min FiO2 (%): 100 %  Prediabetes/morbid obesity: On semaglutide at home.  Keeping on insulin on high-dose steroids.  Blood sugars elevated.  Keep on long-acting insulin.  Add prandial insulin while on high-dose steroids.  Myasthenia gravis: On pyridostigmine that she will continue.  Hypothyroidism: On replacement with levothyroxine and liothyronine that she will continue.  Abnormal liver function tests: Consistent with acute viral infection.  Levels are stabilizing. Hepatic Function Latest Ref Rng & Units 10/02/2019 10/01/2019 09/23/2019  Total Protein 6.5 - 8.1 g/dL 5.9(L) 6.1(L) 6.6  Albumin 3.5 - 5.0 g/dL 2.6(L) 2.8(L) 3.1(L)  AST 15 - 41 U/L 136(H) 191(H) 235(H)  ALT 0 - 44 U/L 122(H) 158(H) 172(H)  Alk Phosphatase 38 - 126 U/L 58 60 70  Total Bilirubin 0.3 - 1.2 mg/dL 0.5 0.7 0.6     DVT prophylaxis: Lovenox subcu   Code Status: Full code except no mechanical intubation Family Communication: Husband on the phone Disposition Plan: Status is: Inpatient  Remains inpatient appropriate because:Inpatient level of care appropriate due to severity of illness   Dispo: The patient is from: Home              Anticipated d/c is to: Home              Anticipated d/c date is: > 3 days, still on very high flow  oxygen.              Patient currently is not medically stable to d/c.         Consultants:   None  Procedures:   None  Antimicrobials:  Antibiotics Given (last 72 hours)    Date/Time Action Medication Dose Rate   10/10/2019 1448 New Bag/Given   remdesivir 200 mg in sodium chloride 0.9% 250 mL IVPB 200 mg 580 mL/hr          Subjective: Patient seen and examined.  No overnight events.  Remains on high flow oxygen, however she has been on good days..  She feels tired however better than yesterday.  Afebrile.  Objective: Vitals:   10/02/19 0600 10/02/19 0740 10/02/19 0820 10/02/19 0900  BP:   (!) 139/56   Pulse: 60 66 64 70  Resp: 18 (!) 24 (!) 30 20  Temp:   99 F (37.2 C)   TempSrc:   Oral   SpO2: (!) 88% 92% (!) 86% 91%  Weight:      Height:        Intake/Output Summary (Last 24 hours) at 10/02/2019 1024 Last data filed at 10/01/2019 1600 Gross per 24 hour  Intake 222 ml  Output --  Net 222 ml   Filed Weights   10/03/2019 1335 10/01/19 1500  Weight: 136.1 kg 135.2 kg    Examination:  General exam: Appears in mild respiratory distress and anxious.  Currently on 30 L of oxygen. Respiratory system: Clear to auscultation. Respiratory effort normal.  No added sounds. Cardiovascular system: S1 & S2 heard, RRR. No JVD, murmurs, rubs, gallops or clicks. No pedal edema. Gastrointestinal system: Abdomen is nondistended, soft and nontender. No organomegaly or masses felt. Normal bowel sounds heard.  Obese and pendulous. Central nervous system: Alert and oriented. No focal neurological deficits. Extremities: Symmetric 5 x 5 power. Skin: No rashes, lesions or ulcers Psychiatry: Judgement and insight appear normal. Mood & affect appropriate.     Data Reviewed: I have personally reviewed following labs and imaging studies  CBC: Recent Labs  Lab 09/28/2019 1237 10/01/19 0615 10/02/19 0243  WBC 2.7* 2.3* 3.2*  NEUTROABS 2.0 1.6* 2.3  HGB 14.3 13.2 12.9  HCT 44.2 40.7 40.5  MCV 86.2 85.7 86.2  PLT 132* 124* 321*   Basic Metabolic Panel: Recent Labs  Lab 09/16/2019 1237 10/01/19 0615 10/02/19 0243  NA 138 138 137  K 4.1 4.6 4.5  CL 104 108 107  CO2 24 22 21*  GLUCOSE 138* 196* 221*  BUN $Re'7 12 17  'JqZ$ CREATININE 0.78 0.73 0.74  CALCIUM 7.5* 7.4* 7.7*  MG  --  2.1 2.2   GFR: Estimated  Creatinine Clearance: 132.9 mL/min (by C-G formula based on SCr of 0.74 mg/dL). Liver Function Tests: Recent Labs  Lab 10/14/2019 1237 10/01/19 0615 10/02/19 0243  AST 235* 191* 136*  ALT 172* 158* 122*  ALKPHOS 70 60 58  BILITOT 0.6 0.7 0.5  PROT 6.6 6.1* 5.9*  ALBUMIN 3.1* 2.8* 2.6*   No results for input(s): LIPASE, AMYLASE in the last 168 hours. No results for input(s): AMMONIA in the last 168 hours. Coagulation Profile: No results for input(s): INR, PROTIME in the last 168 hours. Cardiac Enzymes: No results for input(s): CKTOTAL, CKMB, CKMBINDEX, TROPONINI in the last 168 hours. BNP (last 3 results) No results for input(s): PROBNP in the last 8760 hours. HbA1C: No results for input(s): HGBA1C in the last 72 hours. CBG: Recent Labs  Lab 10/01/19  0740 10/01/19 1129 10/01/19 1644 10/01/19 2208 10/02/19 0803  GLUCAP 178* 249* 199* 211* 194*   Lipid Profile: Recent Labs    10/05/2019 1237  TRIG 64   Thyroid Function Tests: No results for input(s): TSH, T4TOTAL, FREET4, T3FREE, THYROIDAB in the last 72 hours. Anemia Panel: Recent Labs    10/01/19 0615 10/02/19 0243  FERRITIN 777* 616*   Sepsis Labs: Recent Labs  Lab 09/26/2019 1214 09/25/2019 1237 10/01/2019 1437  PROCALCITON  --  <0.10  --   LATICACIDVEN 1.1  --  1.3    Recent Results (from the past 240 hour(s))  Blood Culture (routine x 2)     Status: None (Preliminary result)   Collection Time: 09/20/2019 12:14 PM   Specimen: BLOOD  Result Value Ref Range Status   Specimen Description   Final    BLOOD LEFT ANTECUBITAL Performed at [facility] [address], [place], [place] [address]    Special Requests   Final    BOTTLES DRAWN AEROBIC AND ANAEROBIC Blood Culture adequate volume Performed at [facility] [address] [place], [place] [address]    Culture   Final    NO GROWTH < 24 HOURS Performed at [facility] [address], [place], [place]  [address]    Report Status PENDING  Incomplete  Blood Culture (routine x 2)     Status: None (Preliminary result)   Collection Time: 10/04/2019  1:56 PM   Specimen: BLOOD  Result Value Ref Range Status   Specimen Description BLOOD SITE NOT SPECIFIED  Final   Special Requests   Final    BOTTLES DRAWN AEROBIC AND ANAEROBIC Blood Culture results may not be optimal due to an inadequate volume of blood received in culture bottles   Culture   Final    NO GROWTH < 24 HOURS Performed at [facility], [place] [address], [place], [place] [address]    Report Status PENDING  Incomplete  SARS Coronavirus 2 by RT PCR (hospital order, performed in [facility] hospital lab) Nasopharyngeal Nasopharyngeal Swab     Status: Abnormal   Collection Time: 10/11/2019  2:01 PM   Specimen: Nasopharyngeal Swab  Result Value Ref Range Status   SARS Coronavirus 2 POSITIVE (A) NEGATIVE Final    Comment: RESULT CALLED TO, READ BACK BY AND VERIFIED WITH: WEST,K. RN $RemoveBe'@1513'[id number]$  ON 09.16.2021 BY COHEN,K Performed at [facility], [place] [address], [place], [place] [address]   MRSA PCR Screening     Status: None   Collection Time: 10/01/19  4:05 PM   Specimen: Nasal Mucosa; Nasopharyngeal  Result Value Ref Range Status   MRSA by PCR NEGATIVE NEGATIVE Final    Comment:        The GeneXpert MRSA Assay (FDA approved for NASAL specimens only), is one component of a comprehensive MRSA colonization surveillance program. It is not intended to diagnose MRSA infection nor to guide or monitor treatment for MRSA infections. Performed at [facility], [place] [address] [place], [place] [address]          Radiology Studies: CT Angio Chest PE W/Cm &/Or Wo Cm  Result Date: 10/02/2019 CLINICAL DATA:  COVID positive EXAM: CT ANGIOGRAPHY CHEST WITH CONTRAST TECHNIQUE: Multidetector CT imaging of the chest was performed using the standard protocol during bolus administration of intravenous  contrast. Multiplanar CT image reconstructions and MIPs were obtained to evaluate the vascular anatomy. CONTRAST:  181mL OMNIPAQUE IOHEXOL 350 MG/ML  SOLN COMPARISON:  None. FINDINGS: Cardiovascular: Slightly suboptimal opacification is seen at the main pulmonary artery. However no central or proximal segmental pulmonary embolism is seen. The heart is normal in size. No pericardial effusion or thickening. No evidence right heart strain. There is normal three-vessel brachiocephalic anatomy without proximal stenosis. The thoracic aorta is normal in appearance. Mediastinum/Nodes: Scattered small subcarinal and pretracheal lymph nodes are seen. Thyroid gland, trachea, and esophagus demonstrate no significant findings. Lungs/Pleura: Multifocal patchy airspace opacities are seen throughout both lungs predominantly the right lung base. No pleural effusion is seen. No pneumothorax. Upper Abdomen: No acute abnormalities present in the visualized portions of the upper abdomen. Musculoskeletal: No chest wall abnormality. No acute or significant osseous findings. Review of the MIP images confirms the above findings. IMPRESSION: Slightly suboptimal opacification of the main pulmonary artery, however no central or proximal segmental pulmonary embolism. Extensive multifocal patchy airspace opacities throughout both lungs consistent with multifocal pneumonia. Electronically Signed   By: Prudencio Pair M.D.   On: 09/28/2019 16:13   DG Chest Port 1 View  Result Date: 10/11/2019 CLINICAL DATA:  COVID, hypoxia, shortness of breath EXAM: PORTABLE CHEST 1 VIEW COMPARISON:  None. FINDINGS: Heart upper limits normal in size. Mediastinal contours within normal limits. Patchy bilateral airspace opacities. No effusions or pneumothorax. No acute bony abnormality. IMPRESSION: Patchy bilateral airspace disease compatible with COVID pneumonia. Electronically Signed   By: Rolm Baptise M.D.   On: 10/03/2019 12:33   VAS [place] LOWER EXTREMITY VENOUS  (DVT)  Result Date: 10/01/2019  Lower Venous DVTStudy Indications: Pain, and elevated ddimer.  Comparison Study: no prior Performing Technologist: Abram Sander RVS  Examination Guidelines: A complete evaluation includes B-mode imaging, spectral Doppler, color Doppler, and power Doppler as needed of all accessible portions of each vessel. Bilateral testing is considered an integral part of a complete examination. Limited examinations for reoccurring indications may be performed as noted. The reflux portion of the exam is performed with the patient in reverse Trendelenburg.  +---------+---------------+---------+-----------+----------+--------------+  RIGHT     Compressibility Phasicity Spontaneity Properties Thrombus Aging  +---------+---------------+---------+-----------+----------+--------------+  CFV       Full            Yes       Yes                                    +---------+---------------+---------+-----------+----------+--------------+  SFJ       Full                                                             +---------+---------------+---------+-----------+----------+--------------+  FV Prox   Full                                                             +---------+---------------+---------+-----------+----------+--------------+  FV Mid    Full                                                             +---------+---------------+---------+-----------+----------+--------------+  FV Distal Full                                                             +---------+---------------+---------+-----------+----------+--------------+  PFV       Full                                                             +---------+---------------+---------+-----------+----------+--------------+  POP       Full            Yes       Yes                                    +---------+---------------+---------+-----------+----------+--------------+  PTV       Full                                                              +---------+---------------+---------+-----------+----------+--------------+  PERO      Full                                                             +---------+---------------+---------+-----------+----------+--------------+   +---------+---------------+---------+-----------+----------+--------------+  LEFT      Compressibility Phasicity Spontaneity Properties Thrombus Aging  +---------+---------------+---------+-----------+----------+--------------+  CFV       Full            Yes       Yes                                    +---------+---------------+---------+-----------+----------+--------------+  SFJ       Full                                                             +---------+---------------+---------+-----------+----------+--------------+  FV Prox   Full                                                             +---------+---------------+---------+-----------+----------+--------------+  FV Mid    Full                                                             +---------+---------------+---------+-----------+----------+--------------+  FV Distal Full                                                             +---------+---------------+---------+-----------+----------+--------------+  PFV       Full                                                             +---------+---------------+---------+-----------+----------+--------------+  POP       Full            Yes       Yes                                    +---------+---------------+---------+-----------+----------+--------------+  PTV       Full                                                             +---------+---------------+---------+-----------+----------+--------------+  PERO                                                       Not visualized  +---------+---------------+---------+-----------+----------+--------------+     Summary: BILATERAL: - No evidence of deep vein thrombosis seen in the lower extremities, bilaterally. - No evidence of  superficial venous thrombosis in the lower extremities, bilaterally. -   *See table(s) above for measurements and observations. Electronically signed by Servando Snare MD on 10/01/2019 at 9:21:46 AM.    Final         Scheduled Meds:  aspirin EC  81 mg Oral Daily   baricitinib  4 mg Oral Daily   Chlorhexidine Gluconate Cloth  6 each Topical Daily   enoxaparin (LOVENOX) injection  60 mg Subcutaneous Q24H   furosemide  40 mg Intravenous Once   insulin aspart  0-20 Units Subcutaneous TID WC   insulin aspart  4 Units Subcutaneous TID WC   insulin detemir  0.15 Units/kg Subcutaneous BID   Ipratropium-Albuterol  1 puff Inhalation Q6H   levothyroxine  175 mcg Oral QAC breakfast   linagliptin  5 mg Oral Daily   liothyronine  5 mcg Oral Daily   mouth rinse  15 mL Mouth Rinse BID   methylPREDNISolone (SOLU-MEDROL) injection  0.5 mg/kg Intravenous Q12H   Followed by   Derrill Memo ON 10/04/2019] predniSONE  50 mg Oral QPC breakfast   montelukast  10 mg Oral Daily   oxybutynin  10 mg Oral Daily   pantoprazole  40 mg Oral Daily   pyridostigmine  60 mg Oral QID   Continuous Infusions:  sodium chloride Stopped (09/20/2019 2242)   remdesivir 100 mg in NS 100 mL Stopped (10/01/19 1002)     LOS: 2 days  Time spent: 35 minutes    Barb Merino, MD [facility] Pager [phone]

## 2019-10-03 LAB — CBC WITH DIFFERENTIAL/PLATELET
Abs Immature Granulocytes: 0.06 10*3/uL (ref 0.00–0.07)
Basophils Absolute: 0 10*3/uL (ref 0.0–0.1)
Basophils Relative: 0 %
Eosinophils Absolute: 0 10*3/uL (ref 0.0–0.5)
Eosinophils Relative: 0 %
HCT: 41.4 % (ref 36.0–46.0)
Hemoglobin: 13.2 g/dL (ref 12.0–15.0)
Immature Granulocytes: 1 %
Lymphocytes Relative: 10 %
Lymphs Abs: 0.7 10*3/uL (ref 0.7–4.0)
MCH: 27.7 pg (ref 26.0–34.0)
MCHC: 31.9 g/dL (ref 30.0–36.0)
MCV: 86.8 fL (ref 80.0–100.0)
Monocytes Absolute: 0.6 10*3/uL (ref 0.1–1.0)
Monocytes Relative: 8 %
Neutro Abs: 5.5 10*3/uL (ref 1.7–7.7)
Neutrophils Relative %: 81 %
Platelets: 201 10*3/uL (ref 150–400)
RBC: 4.77 MIL/uL (ref 3.87–5.11)
RDW: 13.5 % (ref 11.5–15.5)
WBC: 6.8 10*3/uL (ref 4.0–10.5)
nRBC: 0 % (ref 0.0–0.2)

## 2019-10-03 LAB — COMPREHENSIVE METABOLIC PANEL
ALT: 105 U/L — ABNORMAL HIGH (ref 0–44)
AST: 111 U/L — ABNORMAL HIGH (ref 15–41)
Albumin: 2.9 g/dL — ABNORMAL LOW (ref 3.5–5.0)
Alkaline Phosphatase: 66 U/L (ref 38–126)
Anion gap: 11 (ref 5–15)
BUN: 24 mg/dL — ABNORMAL HIGH (ref 6–20)
CO2: 22 mmol/L (ref 22–32)
Calcium: 8 mg/dL — ABNORMAL LOW (ref 8.9–10.3)
Chloride: 107 mmol/L (ref 98–111)
Creatinine, Ser: 0.89 mg/dL (ref 0.44–1.00)
GFR calc Af Amer: >60 mL/min (ref >60)
GFR calc non Af Amer: >60 mL/min (ref >60)
Glucose, Bld: 186 mg/dL — ABNORMAL HIGH (ref 70–99)
Potassium: 4.3 mmol/L (ref 3.5–5.1)
Sodium: 140 mmol/L (ref 135–145)
Total Bilirubin: 0.6 mg/dL (ref 0.3–1.2)
Total Protein: 6.2 g/dL — ABNORMAL LOW (ref 6.5–8.1)

## 2019-10-03 LAB — C-REACTIVE PROTEIN: CRP: 1.3 mg/dL — ABNORMAL HIGH (ref <1.0)

## 2019-10-03 LAB — MAGNESIUM: Magnesium: 2.5 mg/dL — ABNORMAL HIGH (ref 1.7–2.4)

## 2019-10-03 LAB — FERRITIN: Ferritin: 455 ng/mL — ABNORMAL HIGH (ref 11–307)

## 2019-10-03 LAB — D-DIMER, QUANTITATIVE: D-Dimer, Quant: 0.86 ug/mL-FEU — ABNORMAL HIGH (ref 0.00–0.50)

## 2019-10-03 MED ORDER — METHYLPREDNISOLONE SODIUM SUCC 125 MG IJ SOLR
60.0000 mg | Freq: Two times a day (BID) | INTRAMUSCULAR | Status: DC
  Administered 2019-10-03 – 2019-10-07 (×8): 60 mg via INTRAVENOUS
  Filled 2019-10-03 (×8): qty 2

## 2019-10-03 MED ORDER — OXYMETAZOLINE HCL 0.05 % NA SOLN
1.0000 | Freq: Two times a day (BID) | NASAL | Status: DC
  Administered 2019-10-03 – 2019-10-07 (×9): 1 via NASAL
  Filled 2019-10-03 (×2): qty 15

## 2019-10-03 MED ORDER — FUROSEMIDE 10 MG/ML IJ SOLN
40.0000 mg | Freq: Once | INTRAMUSCULAR | Status: AC
  Administered 2019-10-03: 40 mg via INTRAVENOUS
  Filled 2019-10-03: qty 4

## 2019-10-03 MED ORDER — SALINE SPRAY 0.65 % NA SOLN
1.0000 | NASAL | Status: DC | PRN
  Administered 2019-10-03 – 2019-10-07 (×2): 1 via NASAL
  Filled 2019-10-03: qty 44

## 2019-10-03 NOTE — Progress Notes (Signed)
Pt had a severe nose bleed that caused her O2 sat to drop into the 50s. Respiratory called, and MD made aware. Got it to stop bleeding and her oxygen went back up. MD put in orders for afrin to try to prevent it from happening again.

## 2019-10-03 NOTE — Progress Notes (Signed)
PROGRESS NOTE    Diane Flowers  XTA:[id number] DOB: 1970-07-10 DOA: 10/06/2019 PCP: Maggie Schwalbe, PA-C    Brief Narrative:  49 year old female, unvaccinated against COVID-19 with history of anxiety, myasthenia gravis, pancreatic tumor, thyroid cancer and hypothyroidism symptomatic since 9/9 tested positive for COVID-19 on 9/10.  Was at infusion clinic for monoclonal antibody however found to be hypoxic to 80% on room air and was sent to ER. In the emergency room, febrile temperature 101, heart rate 101, blood pressures 79/62, 92% on 6 L.  Mild elevated AST and ALT.  CTA chest with no pulmonary embolism but extensive bilateral pneumonia. Patient on increased oxygen requirement.   Assessment & Plan:   Principal Problem:   Acute hypoxemic respiratory failure due to COVID-19 [facility]) Active Problems:   Sepsis ([place])   Hypothyroid   Myasthenia gravis ([place])   Positive D dimer  Acute hypoxemic respiratory to COVID-19 virus infection:  Sepsis present on admission.  Improving. Continue to monitor due to significant symptoms, on very high oxygen requirement. chest physiotherapy, incentive spirometry, deep breathing exercises, sputum induction, mucolytic's and bronchodilators. Supplemental oxygen to keep saturations more than 85%. Covid directed therapy with , steroids, high-dose Solu-Medrol remdesivir, day 4/5 Baricitinib, we discussed about different modalities to treat current pandemic of COVID-19.  Especially with her increasing oxygen requirement and high morbidity and mortality, she will benefit with use of medication like baricitinib along with remdesivir that has improved survival.  Patient does not have obvious contraindication to use this medication.  She has consented.  Will start on baricitinib 4 mg.  Day 3/14 or until hospital discharge. antibiotics, not indicated Due to severity of symptoms, patient will need daily inflammatory markers, chest x-rays, liver function test to  monitor and direct COVID-19 therapies. Try intermittent diuresis.  COVID-19 Labs  Recent Labs    09/21/2019 1237 10/04/2019 1237 10/01/19 0615 10/02/19 0243 10/03/19 0308  DDIMER 0.70*   < > 0.70* 0.82* 0.86*  FERRITIN 844*   < > 777* 616* 455*  LDH 595*  --   --   --   --   CRP 9.2*   < > 9.1* 3.9* 1.3*   < > = values in this interval not displayed.    Lab Results  Component Value Date   SARSCOV2NAA POSITIVE (A) 10/02/2019   SpO2: 92 % O2 Flow Rate (L/min): 35 L/min FiO2 (%): 100 %  Prediabetes/morbid obesity: On semaglutide at home.  Keeping on insulin on high-dose steroids.  Blood sugars elevated.  Keep on long-acting insulin.  Add prandial insulin while on high-dose steroids.  Myasthenia gravis: On pyridostigmine that she will continue.  Hypothyroidism: On replacement with levothyroxine and liothyronine that she will continue.  Abnormal liver function tests: Consistent with acute viral infection.  Levels are stabilizing. Hepatic Function Latest Ref Rng & Units 10/03/2019 10/02/2019 10/01/2019  Total Protein 6.5 - 8.1 g/dL 6.2(L) 5.9(L) 6.1(L)  Albumin 3.5 - 5.0 g/dL 2.9(L) 2.6(L) 2.8(L)  AST 15 - 41 U/L 111(H) 136(H) 191(H)  ALT 0 - 44 U/L 105(H) 122(H) 158(H)  Alk Phosphatase 38 - 126 U/L 66 58 60  Total Bilirubin 0.3 - 1.2 mg/dL 0.6 0.5 0.7     DVT prophylaxis: Lovenox subcu   Code Status: Full code except no mechanical intubation Family Communication: Husband on the phone 9/18 Disposition Plan: Status is: Inpatient  Remains inpatient appropriate because:Inpatient level of care appropriate due to severity of illness   Dispo: The patient is from: Home  Anticipated d/c is to: Home              Anticipated d/c date is: > 3 days, still on very high flow oxygen.              Patient currently is not medically stable to d/c.         Consultants:   None  Procedures:   None  Antimicrobials:  Antibiotics Given (last 72 hours)    Date/Time  Action Medication Dose Rate   10/10/2019 1448 New Bag/Given   remdesivir 200 mg in sodium chloride 0.9% 250 mL IVPB 200 mg 580 mL/hr   10/02/19 1058 New Bag/Given   remdesivir 100 mg in sodium chloride 0.9 % 100 mL IVPB 100 mg 200 mL/hr   10/03/19 1028 New Bag/Given   remdesivir 100 mg in sodium chloride 0.9 % 100 mL IVPB 100 mg 200 mL/hr         Subjective: Patient seen and examined.  No overnight events.  Patient remains on very high flow oxygen.  At rest she feels okay.  Objective: Vitals:   10/03/19 0800 10/03/19 0804 10/03/19 0809 10/03/19 0826  BP: (!) 156/73   (!) 142/63  Pulse: 65   84  Resp: (!) 25   (!) 25  Temp:   97.9 F (36.6 C)   TempSrc:   Oral   SpO2: (!) 89% 92%  92%  Weight:      Height:        Intake/Output Summary (Last 24 hours) at 10/03/2019 1105 Last data filed at 10/03/2019 0800 Gross per 24 hour  Intake 408.59 ml  Output 800 ml  Net -391.41 ml   Filed Weights   09/21/2019 1335 10/01/19 1500  Weight: 136.1 kg 135.2 kg    Examination:  General exam: In moderate respiratory distress on mobility, currently on 35 L of high flow nasal cannula oxygen. Respiratory system: No added sounds. Cardiovascular system: S1 & S2 heard, RRR. No JVD, murmurs, rubs, gallops or clicks. No pedal edema. Gastrointestinal system: Abdomen is nondistended, soft and nontender. No organomegaly or masses felt. Normal bowel sounds heard.  Obese and pendulous. Central nervous system: Alert and oriented. No focal neurological deficits. Extremities: Symmetric 5 x 5 power. Skin: No rashes, lesions or ulcers Psychiatry: Judgement and insight appear normal. Mood & affect appropriate.     Data Reviewed: I have personally reviewed following labs and imaging studies  CBC: Recent Labs  Lab 10/08/2019 1237 10/01/19 0615 10/02/19 0243 10/03/19 0308  WBC 2.7* 2.3* 3.2* 6.8  NEUTROABS 2.0 1.6* 2.3 5.5  HGB 14.3 13.2 12.9 13.2  HCT 44.2 40.7 40.5 41.4  MCV 86.2 85.7 86.2 86.8    PLT 132* 124* 119* 734   Basic Metabolic Panel: Recent Labs  Lab 10/10/2019 1237 10/01/19 0615 10/02/19 0243 10/03/19 0308  NA 138 138 137 140  K 4.1 4.6 4.5 4.3  CL 104 108 107 107  CO2 24 22 21* 22  GLUCOSE 138* 196* 221* 186*  BUN 7 12 17  24*  CREATININE 0.78 0.73 0.74 0.89  CALCIUM 7.5* 7.4* 7.7* 8.0*  MG  --  2.1 2.2 2.5*   GFR: Estimated Creatinine Clearance: 119.5 mL/min (by C-G formula based on SCr of 0.89 mg/dL). Liver Function Tests: Recent Labs  Lab 09/15/2019 1237 10/01/19 0615 10/02/19 0243 10/03/19 0308  AST 235* 191* 136* 111*  ALT 172* 158* 122* 105*  ALKPHOS 70 60 58 66  BILITOT 0.6 0.7 0.5 0.6  PROT 6.6  6.1* 5.9* 6.2*  ALBUMIN 3.1* 2.8* 2.6* 2.9*   No results for input(s): LIPASE, AMYLASE in the last 168 hours. No results for input(s): AMMONIA in the last 168 hours. Coagulation Profile: No results for input(s): INR, PROTIME in the last 168 hours. Cardiac Enzymes: No results for input(s): CKTOTAL, CKMB, CKMBINDEX, TROPONINI in the last 168 hours. BNP (last 3 results) No results for input(s): PROBNP in the last 8760 hours. HbA1C: No results for input(s): HGBA1C in the last 72 hours. CBG: Recent Labs  Lab 10/01/19 2208 10/02/19 0803 10/02/19 1158 10/02/19 1607 10/02/19 2115  GLUCAP 211* 194* 187* 139* 172*   Lipid Profile: Recent Labs    10/11/2019 1237  TRIG 64   Thyroid Function Tests: No results for input(s): TSH, T4TOTAL, FREET4, T3FREE, THYROIDAB in the last 72 hours. Anemia Panel: Recent Labs    10/02/19 0243 10/03/19 0308  FERRITIN 616* 455*   Sepsis Labs: Recent Labs  Lab 09/17/2019 1214 10/11/2019 1237 09/29/2019 1437  PROCALCITON  --  <0.10  --   LATICACIDVEN 1.1  --  1.3    Recent Results (from the past 240 hour(s))  Blood Culture (routine x 2)     Status: None (Preliminary result)   Collection Time: 09/19/2019 12:14 PM   Specimen: BLOOD  Result Value Ref Range Status   Specimen Description   Final    BLOOD LEFT  ANTECUBITAL Performed at [facility], [place] [address], [place], [place] [address]    Special Requests   Final    BOTTLES DRAWN AEROBIC AND ANAEROBIC Blood Culture adequate volume Performed at [place] [address] [place], [place] [address]    Culture   Final    NO GROWTH 2 DAYS Performed at [place] [address], [place], [place] [address]    Report Status PENDING  Incomplete  Blood Culture (routine x 2)     Status: None (Preliminary result)   Collection Time: 10/11/2019  1:56 PM   Specimen: BLOOD  Result Value Ref Range Status   Specimen Description BLOOD SITE NOT SPECIFIED  Final   Special Requests   Final    BOTTLES DRAWN AEROBIC AND ANAEROBIC Blood Culture results may not be optimal due to an inadequate volume of blood received in culture bottles   Culture   Final    NO GROWTH 2 DAYS Performed at [facility], [place] [address], [place], [place] [address]    Report Status PENDING  Incomplete  SARS Coronavirus 2 by RT PCR (hospital order, performed in [facility] hospital lab) Nasopharyngeal Nasopharyngeal Swab     Status: Abnormal   Collection Time: 09/26/2019  2:01 PM   Specimen: Nasopharyngeal Swab  Result Value Ref Range Status   SARS Coronavirus 2 POSITIVE (A) NEGATIVE Final    Comment: RESULT CALLED TO, READ BACK BY AND VERIFIED WITH: WEST,K. RN @1513  ON 09.16.2021 BY COHEN,K Performed at [facility], [place] [address] [place],  [address]   MRSA PCR Screening     Status: None   Collection Time: 10/01/19  4:05 PM   Specimen: Nasal Mucosa; Nasopharyngeal  Result Value Ref Range Status   MRSA by PCR NEGATIVE NEGATIVE Final    Comment:        The GeneXpert MRSA Assay (FDA approved for NASAL specimens only), is one component of a comprehensive MRSA colonization surveillance program. It is not intended to diagnose MRSA infection nor to guide or monitor treatment for  MRSA  infections. Performed at [facility], [place] [address], [place], [place] [address]          Radiology Studies: No results found.      Scheduled Meds: . aspirin EC  81 mg Oral Daily  . baricitinib  4 mg Oral Daily  . Chlorhexidine Gluconate Cloth  6 each Topical Daily  . enoxaparin (LOVENOX) injection  60 mg Subcutaneous Q24H  . furosemide  40 mg Intravenous Once  . insulin aspart  0-20 Units Subcutaneous TID WC  . insulin aspart  4 Units Subcutaneous TID WC  . insulin detemir  0.15 Units/kg Subcutaneous BID  . Ipratropium-Albuterol  1 puff Inhalation Q6H  . levothyroxine  175 mcg Oral QAC breakfast  . linagliptin  5 mg Oral Daily  . liothyronine  5 mcg Oral Daily  . mouth rinse  15 mL Mouth Rinse BID  . montelukast  10 mg Oral Daily  . oxybutynin  10 mg Oral Daily  . pantoprazole  40 mg Oral Daily  . [START ON 10/04/2019] predniSONE  50 mg Oral QPC breakfast  . pyridostigmine  60 mg Oral QID   Continuous Infusions: . sodium chloride Stopped (10/08/2019 1632)  . remdesivir 100 mg in NS 100 mL 100 mg (10/03/19 1028)     LOS: 3 days    Time spent: 35 minutes    Barb Merino, MD [facility] Pager [phone]

## 2019-10-04 ENCOUNTER — Encounter (HOSPITAL_COMMUNITY): Payer: Self-pay | Admitting: Internal Medicine

## 2019-10-04 ENCOUNTER — Inpatient Hospital Stay (HOSPITAL_COMMUNITY): Payer: Commercial Managed Care - PPO

## 2019-10-04 DIAGNOSIS — U071 COVID-19: Principal | ICD-10-CM

## 2019-10-04 DIAGNOSIS — J9601 Acute respiratory failure with hypoxia: Secondary | ICD-10-CM

## 2019-10-04 LAB — GLUCOSE, CAPILLARY
Glucose-Capillary: 202 mg/dL — ABNORMAL HIGH (ref 70–99)
Glucose-Capillary: 101 mg/dL — ABNORMAL HIGH (ref 70–99)
Glucose-Capillary: 169 mg/dL — ABNORMAL HIGH (ref 70–99)
Glucose-Capillary: 105 mg/dL — ABNORMAL HIGH (ref 70–99)
Glucose-Capillary: 145 mg/dL — ABNORMAL HIGH (ref 70–99)
Glucose-Capillary: 124 mg/dL — ABNORMAL HIGH (ref 70–99)
Glucose-Capillary: 134 mg/dL — ABNORMAL HIGH (ref 70–99)

## 2019-10-04 LAB — CBC WITH DIFFERENTIAL/PLATELET
Abs Immature Granulocytes: 0.08 10*3/uL — ABNORMAL HIGH (ref 0.00–0.07)
Basophils Absolute: 0 10*3/uL (ref 0.0–0.1)
Basophils Relative: 0 %
Eosinophils Absolute: 0 10*3/uL (ref 0.0–0.5)
Eosinophils Relative: 0 %
HCT: 39.5 % (ref 36.0–46.0)
Hemoglobin: 12.5 g/dL (ref 12.0–15.0)
Immature Granulocytes: 1 %
Lymphocytes Relative: 7 %
Lymphs Abs: 0.5 10*3/uL — ABNORMAL LOW (ref 0.7–4.0)
MCH: 27.4 pg (ref 26.0–34.0)
MCHC: 31.6 g/dL (ref 30.0–36.0)
MCV: 86.6 fL (ref 80.0–100.0)
Monocytes Absolute: 0.5 10*3/uL (ref 0.1–1.0)
Monocytes Relative: 8 %
Neutro Abs: 5.5 10*3/uL (ref 1.7–7.7)
Neutrophils Relative %: 84 %
Platelets: 212 10*3/uL (ref 150–400)
RBC: 4.56 MIL/uL (ref 3.87–5.11)
RDW: 13.5 % (ref 11.5–15.5)
WBC: 6.5 10*3/uL (ref 4.0–10.5)
nRBC: 0 % (ref 0.0–0.2)

## 2019-10-04 LAB — COMPREHENSIVE METABOLIC PANEL
ALT: 85 U/L — ABNORMAL HIGH (ref 0–44)
AST: 82 U/L — ABNORMAL HIGH (ref 15–41)
Albumin: 2.9 g/dL — ABNORMAL LOW (ref 3.5–5.0)
Alkaline Phosphatase: 68 U/L (ref 38–126)
Anion gap: 10 (ref 5–15)
BUN: 23 mg/dL — ABNORMAL HIGH (ref 6–20)
CO2: 27 mmol/L (ref 22–32)
Calcium: 7.7 mg/dL — ABNORMAL LOW (ref 8.9–10.3)
Chloride: 104 mmol/L (ref 98–111)
Creatinine, Ser: 0.81 mg/dL (ref 0.44–1.00)
GFR calc Af Amer: >60 mL/min (ref >60)
GFR calc non Af Amer: >60 mL/min (ref >60)
Glucose, Bld: 197 mg/dL — ABNORMAL HIGH (ref 70–99)
Potassium: 4.3 mmol/L (ref 3.5–5.1)
Sodium: 141 mmol/L (ref 135–145)
Total Bilirubin: 0.8 mg/dL (ref 0.3–1.2)
Total Protein: 5.9 g/dL — ABNORMAL LOW (ref 6.5–8.1)

## 2019-10-04 LAB — C-REACTIVE PROTEIN: CRP: 1 mg/dL — ABNORMAL HIGH (ref <1.0)

## 2019-10-04 LAB — MAGNESIUM: Magnesium: 2.4 mg/dL (ref 1.7–2.4)

## 2019-10-04 LAB — D-DIMER, QUANTITATIVE: D-Dimer, Quant: 4.59 ug/mL-FEU — ABNORMAL HIGH (ref 0.00–0.50)

## 2019-10-04 LAB — FERRITIN: Ferritin: 253 ng/mL (ref 11–307)

## 2019-10-04 MED ORDER — BENZONATATE 100 MG PO CAPS
100.0000 mg | ORAL_CAPSULE | Freq: Three times a day (TID) | ORAL | Status: DC | PRN
  Administered 2019-10-04 – 2019-10-08 (×7): 100 mg via ORAL
  Filled 2019-10-04 (×9): qty 1

## 2019-10-04 MED ORDER — FUROSEMIDE 10 MG/ML IJ SOLN
40.0000 mg | Freq: Once | INTRAMUSCULAR | Status: AC
  Administered 2019-10-04: 40 mg via INTRAVENOUS
  Filled 2019-10-04: qty 4

## 2019-10-04 NOTE — TOC Initial Note (Signed)
Transition of Care [facility]) - Initial/Assessment Note    Patient Details  Name: Diane Flowers MRN: [id number] Date of Birth: 02-Aug-1970  Transition of Care [facility]) CM/SW Contact:    Leeroy Cha, RN Phone Number: 10/04/2019, 2:02 PM  Clinical Narrative:                 COVID + unvaccinated female, hfnrb mask at 15l/min, iv solu medrol,  Plan is to return to home. Will follow for progression.  Expected Discharge Plan: Home/Self Care Barriers to Discharge: Continued Medical Work up   Patient Goals and CMS Choice Patient states their goals for this hospitalization and ongoing recovery are:: to go home CMS Medicare.gov Compare Post Acute Care list provided to:: Patient    Expected Discharge Plan and Services Expected Discharge Plan: Home/Self Care   Discharge Planning Services: CM Consult   Living arrangements for the past 2 months: Single Family Home                                      Prior Living Arrangements/Services Living arrangements for the past 2 months: Single Family Home Lives with:: Spouse Patient language and need for interpreter reviewed:: Yes Do you feel safe going back to the place where you live?: Yes      Need for Family Participation in Patient Care: Yes (Comment) Care giver support system in place?: Yes (comment)   Criminal Activity/Legal Involvement Pertinent to Current Situation/Hospitalization: No - Comment as needed  Activities of Daily Living Home Assistive Devices/Equipment: Eyeglasses ADL Screening (condition at time of admission) Patient's cognitive ability adequate to safely complete daily activities?: Yes Is the patient deaf or have difficulty hearing?: No Does the patient have difficulty seeing, even when wearing glasses/contacts?: No Does the patient have difficulty concentrating, remembering, or making decisions?: No Patient able to express need for assistance with ADLs?: Yes Does the patient have difficulty dressing or  bathing?: No Independently performs ADLs?: Yes (appropriate for developmental age) Does the patient have difficulty walking or climbing stairs?: Yes (secondary to shortness of breath) Weakness of Legs: Both Weakness of Arms/Hands: None  Permission Sought/Granted                  Emotional Assessment Appearance:: Appears stated age     Orientation: : Oriented to Self, Oriented to Place, Oriented to  Time, Oriented to Situation Alcohol / Substance Use: Not Applicable Psych Involvement: No (comment)  Admission diagnosis:  Hypoxia [R09.02] Acute hypoxemic respiratory failure due to COVID-19 ([place]) [U07.1, J96.01] Pneumonia due to COVID-19 virus [U07.1, J12.82] Patient Active Problem List   Diagnosis Date Noted  . Acute hypoxemic respiratory failure due to COVID-19 ([place]) 09/27/2019  . Sepsis ([place]) 10/05/2019  . Hypothyroid 10/07/2019  . Myasthenia gravis ([place]) 10/05/2019  . Positive D dimer 10/07/2019   PCP:  Maggie Schwalbe, PA-C Pharmacy:   [place], [place] [address] Phone: [phone] Fax: [place], [place] [address] Phone: [phone] Fax: [phone]     Social Determinants of Health (SDOH) Interventions    Readmission Risk Interventions No flowsheet data found.

## 2019-10-04 NOTE — Progress Notes (Signed)
NAME:  Diane Flowers, MRN:  [id number], DOB:  1970/01/21, LOS: 4 ADMISSION DATE:  09/15/2019, CONSULTATION DATE: 10/04/19 REFERRING MD: Dr. Sloan Leiter, CHIEF COMPLAINT: Epistaxis   Brief History   [name] admitted 9/16 with hypoxic respiratory failure in the setting of COVID PNA.    History of present illness   [name], unvaccinated female, who presented to [facility] on 9/16 with reports of low oxygen saturations.    The patient initially began to have symptoms 9/9 and subsequently tested positive on 9/12.  She was referred to the monoclonal antibody infusion clinic and was found to be hypoxic with room air saturations of 80%.  She was referred to the ER for evaluation. In the ER she was found to be febrile, tachycardic and hypotensive.  AST / ALT were mildly elevated.  CT angio of the chest was assessed and negative for PE but showed extensive multifocal patchy airspace opacities. The patient was treated with high dose steroids, remdesivir and baricitinib.  Hospital course complicated by epistaxis which was initially controlled with applied pressure.  She was intermittently diuresed.  The patient continued to require 100% FiO2 with 30L flow via heated high flow.  She developed daytime sleepiness and concern for respiratory fatigue.    PCCM consulted for evaluation.   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 at [facility]  Uterine Mass  Hypothyroidism  Splenic artery aneurysm Hiatal hernia  Vitamin D Deficiency  GERD  Significant Hospital Events   9/16 Admit  9/20 PCCM consulted for epistaxis, hypoxic respiratory failure in setting of COVID PNA  Consults:    Procedures:    Significant Diagnostic Tests:   CT Angio Chest 9/16  >> no central pulmonary embolism, extensive multifocal patchy airspace opacities   LE Vascular Duplex 9/17 >> negative for DVT bilaterally  Micro Data:  COVID 9/16 >> positive   Antimicrobials:    Interim history/subjective:  Tmax 99 Remains on 30L flow / 100% fiO2   Objective   Blood pressure (!) 158/79, pulse 90, temperature 99 F (37.2 C), temperature source Oral, resp. rate (!) 26, height 6' (1.829 m), weight 135.2 kg, SpO2 (!) 88 %.    FiO2 (%):  [90 %-100 %] 100 %   Intake/Output Summary (Last 24 hours) at 10/04/2019 1602 Last data filed at 10/04/2019 1500 Gross per 24 hour  Intake 100 ml  Output 1600 ml  Net -1500 ml   Filed Weights   09/28/2019 1335 10/01/19 1500  Weight: 136.1 kg 135.2 kg    Examination: Gen:      No acute distress HEENT:  EOMI, sclera anicteric Neck:     No masses; no thyromegaly Lungs:    Scattered crackles CV:         Regular rate and rhythm; no murmurs Abd:      + bowel sounds; soft, non-tender; no palpable masses, no distension Ext:    No edema; adequate peripheral perfusion Skin:      Warm and dry; no rash Neuro: Somnolent, arousable  Resolved Hospital Problem list     Assessment & Plan:   Acute Hypoxic Respiratory Failure in setting of COVID PNA Epistaxis secondary to HF Oxygen Continue steroids, remdesivir, baricitinib Prone positioning as tolerated Watch for recurrent epistaxis Lasix for diuresis We  will need close monitoring as she is at high risk for needing intubation  Steroid induced hypoglycemia Continue SSI, linagliptin  Best practice:  Diet: as tolerated, aspiration precautions Pain/Anxiety/Delirium protocol (if indicated): n/a  VAP protocol (if indicated): n/a  DVT prophylaxis: enoxaparin  GI prophylaxis: PPI on high dose steroids  Glucose control: SSI  Mobility: as tolerated  Code Status: Full Code  Family Communication: per primary  Disposition:   Labs   CBC: Recent Labs  Lab 09/27/2019 1237  10/01/19 0615 10/02/19 0243 10/03/19 0308 10/04/19 0257  WBC 2.7* 2.3* 3.2* 6.8 6.5  NEUTROABS 2.0 1.6* 2.3 5.5 5.5  HGB 14.3 13.2 12.9 13.2 12.5  HCT 44.2 40.7 40.5 41.4 39.5  MCV 86.2 85.7 86.2 86.8 86.6  PLT 132* 124* 119* 201 209    Basic Metabolic Panel: Recent Labs  Lab 09/25/2019 1237 10/01/19 0615 10/02/19 0243 10/03/19 0308 10/04/19 0257  NA 138 138 137 140 141  K 4.1 4.6 4.5 4.3 4.3  CL 104 108 107 107 104  CO2 24 22 21* 22 27  GLUCOSE 138* 196* 221* 186* 197*  BUN 7 12 17  24* 23*  CREATININE 0.78 0.73 0.74 0.89 0.81  CALCIUM 7.5* 7.4* 7.7* 8.0* 7.7*  MG  --  2.1 2.2 2.5* 2.4   GFR: Estimated Creatinine Clearance: 131.3 mL/min (by C-G formula based on SCr of 0.81 mg/dL). Recent Labs  Lab 09/16/2019 1214 10/07/2019 1237 09/21/2019 1237 09/25/2019 1437 10/01/19 0615 10/02/19 0243 10/03/19 0308 10/04/19 0257  PROCALCITON  --  <0.10  --   --   --   --   --   --   WBC  --  2.7*   < >  --  2.3* 3.2* 6.8 6.5  LATICACIDVEN 1.1  --   --  1.3  --   --   --   --    < > = values in this interval not displayed.    Liver Function Tests: Recent Labs  Lab 10/06/2019 1237 10/01/19 0615 10/02/19 0243 10/03/19 0308 10/04/19 0257  AST 235* 191* 136* 111* 82*  ALT 172* 158* 122* 105* 85*  ALKPHOS 70 60 58 66 68  BILITOT 0.6 0.7 0.5 0.6 0.8  PROT 6.6 6.1* 5.9* 6.2* 5.9*  ALBUMIN 3.1* 2.8* 2.6* 2.9* 2.9*   No results for input(s): LIPASE, AMYLASE in the last 168 hours. No results for input(s): AMMONIA in the last 168 hours.  ABG No results found for: PHART, PCO2ART, PO2ART, HCO3, TCO2, ACIDBASEDEF, O2SAT   Coagulation Profile: No results for input(s): INR, PROTIME in the last 168 hours.  Cardiac Enzymes: No results for input(s): CKTOTAL, CKMB, CKMBINDEX, TROPONINI in the last 168 hours.  HbA1C: No results found for: HGBA1C  CBG: Recent Labs  Lab 10/03/19 1257 10/03/19 1654 10/03/19 2104 10/04/19 0806 10/04/19 1136  GLUCAP 101* 169* 105* 145* 124*     Review of Systems: Positives in [place]:   All negative; except for those that are bolded, which indicate positives.  Constitutional: weight loss, weight gain, night sweats, fevers, chills, fatigue, weakness.  HEENT: headaches, sore throat, sneezing, nasal congestion, post nasal drip, difficulty swallowing, tooth/dental problems, visual complaints, visual changes, ear aches. Neuro: difficulty with speech, weakness, numbness, ataxia. CV:  chest pain, orthopnea, PND, swelling in lower extremities, dizziness, palpitations, syncope.  Resp: cough, hemoptysis, dyspnea, wheezing. GI: heartburn, indigestion, abdominal pain, nausea, vomiting, diarrhea, constipation, change in bowel habits, loss of appetite, hematemesis, melena, hematochezia.  GU: dysuria, change  in color of urine, urgency or frequency, flank pain, hematuria. MSK: joint pain or swelling, decreased range of motion. Psych: change in mood or affect, depression, anxiety, suicidal ideations, homicidal ideations. Skin: rash, itching, bruising.  Past Medical History  She,  has no past medical history on file.   Surgical History   History reviewed. No pertinent surgical history.   Social History   reports that she has never smoked. She has never used smokeless tobacco. She reports that she does not drink alcohol and does not use drugs.   Family History   Her family history is not on file.   Allergies Allergies  Allergen Reactions  . Flu Virus Vaccine Anaphylaxis  . Sulfa Antibiotics Anaphylaxis  . Sulfamethoxazole Anaphylaxis  . Scopolamine Swelling     Home Medications  Prior to Admission medications   Medication Sig Start Date End Date Taking? Authorizing Provider  acetaminophen (TYLENOL) 500 MG tablet Take 500 mg by mouth every 6 (six) hours as needed.   Yes [provider]  aspirin EC 81 MG tablet Take 81 mg by mouth daily. Swallow whole.   Yes [provider]  celecoxib (CELEBREX)  200 MG capsule Take 200 mg by mouth in the morning and at bedtime.   Yes [provider]  estradiol (VIVELLE-DOT) 0.0375 MG/24HR Place 1 patch onto the skin 2 (two) times a week. Sunday and Wednesday 04/29/18 04/07/20 Yes [provider]  levothyroxine (SYNTHROID) 175 MCG tablet Take 175 mcg by mouth daily before breakfast.   Yes [provider]  liothyronine (CYTOMEL) 5 MCG tablet Take 5 mcg by mouth daily.   Yes [provider]  montelukast (SINGULAIR) 10 MG tablet Take 10 mg by mouth daily. 08/26/19  Yes [provider]  oxybutynin (DITROPAN-XL) 10 MG 24 hr tablet Take 10 mg by mouth daily. 07/29/19  Yes [provider]  pantoprazole (PROTONIX) 40 MG tablet Take 40 mg by mouth daily.  03/30/19  Yes [provider]  pyridostigmine (MESTINON) 60 MG tablet Take 60 mg by mouth 4 (four) times daily. 03/29/19  Yes [provider]  Semaglutide,0.25 or 0.5MG/DOS, (OZEMPIC, 0.25 OR 0.5 MG/DOSE,) 2 MG/1.5ML SOPN Inject 0.5 mg into the skin every Sunday. 08/09/19 11/07/19 Yes [provider]     Critical care time:    The patient is critically ill with multiple organ system failure and requires high complexity decision making for assessment and support, frequent evaluation and titration of therapies, advanced monitoring, review of radiographic studies and interpretation of complex data.   Critical Care Time devoted to patient care services, exclusive of separately billable procedures, described in this note is 45 minutes.   Marshell Garfinkel MD [facility] Please see [email or web address] for pager details.  10/04/2019, 5:09 PM

## 2019-10-04 NOTE — Progress Notes (Signed)
PROGRESS NOTE    Diane Flowers  GOT:[id number] DOB: Jun 13, 1970 DOA: 10/07/2019 PCP: Maggie Schwalbe, PA-C    Brief Narrative:  49 year old female, unvaccinated against COVID-19 with history of anxiety, myasthenia gravis, pancreatic tumor, thyroid cancer and hypothyroidism symptomatic since 9/9 tested positive for COVID-19 on 9/10.  Was at infusion clinic for monoclonal antibody however found to be hypoxic to 80% on room air and was sent to ER. In the emergency room, febrile temperature 101, heart rate 101, blood pressures 79/62, 92% on 6 L.  Mild elevated AST and ALT.  CTA chest with no pulmonary embolism but extensive bilateral pneumonia. Patient on increased oxygen requirement.   Assessment & Plan:   Principal Problem:   Acute hypoxemic respiratory failure due to COVID-19 [facility]) Active Problems:   Sepsis ([place])   Hypothyroid   Myasthenia gravis ([place])   Positive D dimer  Acute hypoxemic respiratory to COVID-19 virus infection:  Sepsis present on admission.  Improving. Continue to monitor due to significant symptoms, on very high oxygen requirement. chest physiotherapy, incentive spirometry, deep breathing exercises, sputum induction, mucolytic's and bronchodilators. Supplemental oxygen to keep saturations more than 85%. Covid directed therapy with , steroids, high-dose Solu-Medrol remdesivir, day 5/5 Baricitinib, we discussed about different modalities to treat current pandemic of COVID-19.  Especially with her increasing oxygen requirement and high morbidity and mortality, she will benefit with use of medication like baricitinib along with remdesivir that has improved survival.  Patient does not have obvious contraindication to use this medication.  She has consented.  Will start on baricitinib 4 mg.  Day 4/14 or until hospital discharge. antibiotics, not indicated Due to severity of symptoms, patient will need daily inflammatory markers, chest x-rays, liver function test to  monitor and direct COVID-19 therapies. Try intermittent diuresis. D-dimer is significantly elevated today from 0.86-4.59. Patient is having significant nosebleeding and remains on prophylactic Lovenox. CTA of the chest and lower extremity duplexes with no evidence of blood clot on 9/17. We will recheck D-dimer, if not trending down, will repeat both the studies tomorrow. Repeat chest x-ray shows stable and improved consolidation today.  COVID-19 Labs  Recent Labs    10/02/19 0243 10/03/19 0308 10/04/19 0257  DDIMER 0.82* 0.86* 4.59*  FERRITIN 616* 455* 253  CRP 3.9* 1.3* 1.0*    Lab Results  Component Value Date   SARSCOV2NAA POSITIVE (A) 09/20/2019   SpO2: 93 % O2 Flow Rate (L/min): 15 L/min FiO2 (%): 100 %  Prediabetes/morbid obesity: On semaglutide at home.  Keeping on insulin on high-dose steroids.  Blood sugars elevated.  Keep on long-acting insulin.  Add prandial insulin while on high-dose steroids.  Myasthenia gravis: On pyridostigmine that she will continue.  Hypothyroidism: On replacement with levothyroxine and liothyronine that she will continue.  Abnormal liver function tests: Consistent with acute viral infection.  Levels are stabilizing. Hepatic Function Latest Ref Rng & Units 10/04/2019 10/03/2019 10/02/2019  Total Protein 6.5 - 8.1 g/dL 5.9(L) 6.2(L) 5.9(L)  Albumin 3.5 - 5.0 g/dL 2.9(L) 2.9(L) 2.6(L)  AST 15 - 41 U/L 82(H) 111(H) 136(H)  ALT 0 - 44 U/L 85(H) 105(H) 122(H)  Alk Phosphatase 38 - 126 U/L 68 66 58  Total Bilirubin 0.3 - 1.2 mg/dL 0.8 0.6 0.5   Epistaxis: Related to use of heated high flow.  Stopped with conservative management now.  Use Afrin twice a day for 3 days.  Moisturizing.  Humidifier.  DVT prophylaxis: Lovenox subcu   Code Status: Full code. Patient had previously stated desire  not to be intubated however on clear communication, she wished to be intubated if needed to improve from current condition. She does not want to be on a  ventilator for long time if no improvement or remains vegetative. Family Communication: Husband on the phone Disposition Plan: Status is: Inpatient  Remains inpatient appropriate because:Inpatient level of care appropriate due to severity of illness   Dispo: The patient is from: Home              Anticipated d/c is to: Home              Anticipated d/c date is: > 3 days, still on very high flow oxygen.              Patient currently is not medically stable to d/c.    Consultants:   None  Procedures:   None  Antimicrobials:  Antibiotics Given (last 72 hours)    Date/Time Action Medication Dose Rate   10/02/19 1058 New Bag/Given   remdesivir 100 mg in sodium chloride 0.9 % 100 mL IVPB 100 mg 200 mL/hr   10/03/19 1028 New Bag/Given   remdesivir 100 mg in sodium chloride 0.9 % 100 mL IVPB 100 mg 200 mL/hr         Subjective: Seen and examined.  Had recurrent epistaxis yesterday evening stopped with conservative management.  She feels okay at rest, loses breath on mobility from bed to chair.  Remains anywhere on nonrebreather and 35 L of oxygen. Feels cold on her sinuses and mouth and throat with high flow oxygen.  Objective: Vitals:   10/04/19 0400 10/04/19 0730 10/04/19 0800 10/04/19 0900  BP: (!) 143/64  134/73   Pulse: (!) 52 (!) 55 65   Resp: (!) 21 20 (!) 26   Temp:    99.6 F (37.6 C)  TempSrc:    Oral  SpO2: (!) 88% 95% 93%   Weight:      Height:        Intake/Output Summary (Last 24 hours) at 10/04/2019 1043 Last data filed at 10/03/2019 1800 Gross per 24 hour  Intake 100 ml  Output 1200 ml  Net -1100 ml   Filed Weights   10/02/2019 1335 10/01/19 1500  Weight: 136.1 kg 135.2 kg    Examination:  General exam: In moderate distress and anxious on 15 L of nonrebreather today. Respiratory system: No added sounds. Cardiovascular system: S1 & S2 heard, RRR. No JVD, murmurs, rubs, gallops or clicks. No pedal edema. Gastrointestinal system: Abdomen is  nondistended, soft and nontender. No organomegaly or masses felt. Normal bowel sounds heard.  Obese and pendulous. Central nervous system: Alert and oriented. No focal neurological deficits. Extremities: Symmetric 5 x 5 power. Skin: No rashes, lesions or ulcers Psychiatry: Judgement and insight appear normal. Mood & affect appropriate.     Data Reviewed: I have personally reviewed following labs and imaging studies  CBC: Recent Labs  Lab 10/09/2019 1237 10/01/19 0615 10/02/19 0243 10/03/19 0308 10/04/19 0257  WBC 2.7* 2.3* 3.2* 6.8 6.5  NEUTROABS 2.0 1.6* 2.3 5.5 5.5  HGB 14.3 13.2 12.9 13.2 12.5  HCT 44.2 40.7 40.5 41.4 39.5  MCV 86.2 85.7 86.2 86.8 86.6  PLT 132* 124* 119* 201 563   Basic Metabolic Panel: Recent Labs  Lab 09/26/2019 1237 10/01/19 0615 10/02/19 0243 10/03/19 0308 10/04/19 0257  NA 138 138 137 140 141  K 4.1 4.6 4.5 4.3 4.3  CL 104 108 107 107 104  CO2 24 22  21* 22 27  GLUCOSE 138* 196* 221* 186* 197*  BUN 7 12 17  24* 23*  CREATININE 0.78 0.73 0.74 0.89 0.81  CALCIUM 7.5* 7.4* 7.7* 8.0* 7.7*  MG  --  2.1 2.2 2.5* 2.4   GFR: Estimated Creatinine Clearance: 131.3 mL/min (by C-G formula based on SCr of 0.81 mg/dL). Liver Function Tests: Recent Labs  Lab 10/04/2019 1237 10/01/19 0615 10/02/19 0243 10/03/19 0308 10/04/19 0257  AST 235* 191* 136* 111* 82*  ALT 172* 158* 122* 105* 85*  ALKPHOS 70 60 58 66 68  BILITOT 0.6 0.7 0.5 0.6 0.8  PROT 6.6 6.1* 5.9* 6.2* 5.9*  ALBUMIN 3.1* 2.8* 2.6* 2.9* 2.9*   No results for input(s): LIPASE, AMYLASE in the last 168 hours. No results for input(s): AMMONIA in the last 168 hours. Coagulation Profile: No results for input(s): INR, PROTIME in the last 168 hours. Cardiac Enzymes: No results for input(s): CKTOTAL, CKMB, CKMBINDEX, TROPONINI in the last 168 hours. BNP (last 3 results) No results for input(s): PROBNP in the last 8760 hours. HbA1C: No results for input(s): HGBA1C in the last 72  hours. CBG: Recent Labs  Lab 10/01/19 2208 10/02/19 0803 10/02/19 1158 10/02/19 1607 10/02/19 2115  GLUCAP 211* 194* 187* 139* 172*   Lipid Profile: No results for input(s): CHOL, HDL, LDLCALC, TRIG, CHOLHDL, LDLDIRECT in the last 72 hours. Thyroid Function Tests: No results for input(s): TSH, T4TOTAL, FREET4, T3FREE, THYROIDAB in the last 72 hours. Anemia Panel: Recent Labs    10/03/19 0308 10/04/19 0257  FERRITIN 455* 253   Sepsis Labs: Recent Labs  Lab 10/05/2019 1214 10/13/2019 1237 10/01/2019 1437  PROCALCITON  --  <0.10  --   LATICACIDVEN 1.1  --  1.3    Recent Results (from the past 240 hour(s))  Blood Culture (routine x 2)     Status: None (Preliminary result)   Collection Time: 09/23/2019 12:14 PM   Specimen: BLOOD  Result Value Ref Range Status   Specimen Description   Final    BLOOD LEFT ANTECUBITAL Performed at [facility], [place] [address] [place], [place] [address]    Special Requests   Final    BOTTLES DRAWN AEROBIC AND ANAEROBIC Blood Culture adequate volume Performed at [place] [address] [place], [place] [address]    Culture   Final    NO GROWTH 3 DAYS Performed at [facility], [place] [address] [place], [place] [address]    Report Status PENDING  Incomplete  Blood Culture (routine x 2)     Status: None (Preliminary result)   Collection Time: 10/14/2019  1:56 PM   Specimen: BLOOD  Result Value Ref Range Status   Specimen Description BLOOD SITE NOT SPECIFIED  Final   Special Requests   Final    BOTTLES DRAWN AEROBIC AND ANAEROBIC Blood Culture results may not be optimal due to an inadequate volume of blood received in culture bottles   Culture   Final    NO GROWTH 3 DAYS Performed at [facility], [place] [address], [place],  [address]    Report Status PENDING  Incomplete  SARS Coronavirus 2 by RT PCR (hospital order, performed in [facility] hospital lab) Nasopharyngeal Nasopharyngeal  Swab     Status: Abnormal   Collection Time: 09/27/2019  2:01 PM   Specimen: Nasopharyngeal Swab  Result Value Ref Range Status   SARS Coronavirus 2 POSITIVE (A) NEGATIVE Final    Comment: RESULT CALLED TO,  READ BACK BY AND VERIFIED WITH: WEST,K. RN @1513  ON 09.16.2021 BY COHEN,K Performed at [facility], [place] [address], [place], [place] [address]   MRSA PCR Screening     Status: None   Collection Time: 10/01/19  4:05 PM   Specimen: Nasal Mucosa; Nasopharyngeal  Result Value Ref Range Status   MRSA by PCR NEGATIVE NEGATIVE Final    Comment:        The GeneXpert MRSA Assay (FDA approved for NASAL specimens only), is one component of a comprehensive MRSA colonization surveillance program. It is not intended to diagnose MRSA infection nor to guide or monitor treatment for MRSA infections. Performed at [facility], [place] [address], [place], [place] [address]          Radiology Studies: DG CHEST PORT 1 VIEW  Result Date: 10/04/2019 CLINICAL DATA:  Cough and shortness of breath.  COVID-19 positive EXAM: PORTABLE CHEST 1 VIEW COMPARISON:  Chest radiograph and chest CT September 30, 2019 FINDINGS: There is overall less airspace opacity bilaterally compared to recent studies. Airspace opacity does remain in the mid and lower lung regions to a lesser degree. No new opacity evident. Heart is mildly enlarged with pulmonary vascularity normal. No adenopathy no bone lesions. IMPRESSION: Multifocal pneumonia remains although less prominent than on recent studies. Stable cardiac prominence. No adenopathy evident. Electronically Signed   By: Lowella Grip III M.D.   On: 10/04/2019 10:24        Scheduled Meds:  aspirin EC  81 mg Oral Daily   baricitinib  4 mg Oral Daily   Chlorhexidine Gluconate Cloth  6 each Topical Daily   enoxaparin (LOVENOX) injection  60 mg Subcutaneous Q24H   furosemide  40 mg Intravenous Once   insulin aspart  0-20  Units Subcutaneous TID WC   insulin aspart  4 Units Subcutaneous TID WC   insulin detemir  0.15 Units/kg Subcutaneous BID   Ipratropium-Albuterol  1 puff Inhalation Q6H   levothyroxine  175 mcg Oral QAC breakfast   linagliptin  5 mg Oral Daily   liothyronine  5 mcg Oral Daily   mouth rinse  15 mL Mouth Rinse BID   methylPREDNISolone (SOLU-MEDROL) injection  60 mg Intravenous Q12H   montelukast  10 mg Oral Daily   oxybutynin  10 mg Oral Daily   oxymetazoline  1 spray Each Nare BID   pantoprazole  40 mg Oral Daily   pyridostigmine  60 mg Oral QID   Continuous Infusions:  sodium chloride Stopped (10/02/2019 1632)   remdesivir 100 mg in NS 100 mL Stopped (10/03/19 1059)     LOS: 4 days    Time spent: 30 minutes    Barb Merino, MD [facility] Pager [phone]

## 2019-10-05 ENCOUNTER — Inpatient Hospital Stay (HOSPITAL_COMMUNITY): Payer: Commercial Managed Care - PPO

## 2019-10-05 DIAGNOSIS — M7989 Other specified soft tissue disorders: Secondary | ICD-10-CM

## 2019-10-05 DIAGNOSIS — R609 Edema, unspecified: Secondary | ICD-10-CM

## 2019-10-05 DIAGNOSIS — J9601 Acute respiratory failure with hypoxia: Secondary | ICD-10-CM

## 2019-10-05 DIAGNOSIS — U071 COVID-19: Secondary | ICD-10-CM

## 2019-10-05 LAB — COMPREHENSIVE METABOLIC PANEL
ALT: 70 U/L — ABNORMAL HIGH (ref 0–44)
AST: 61 U/L — ABNORMAL HIGH (ref 15–41)
Albumin: 2.8 g/dL — ABNORMAL LOW (ref 3.5–5.0)
Alkaline Phosphatase: 64 U/L (ref 38–126)
Anion gap: 9 (ref 5–15)
BUN: 21 mg/dL — ABNORMAL HIGH (ref 6–20)
CO2: 29 mmol/L (ref 22–32)
Calcium: 7.8 mg/dL — ABNORMAL LOW (ref 8.9–10.3)
Chloride: 102 mmol/L (ref 98–111)
Creatinine, Ser: 0.67 mg/dL (ref 0.44–1.00)
GFR calc Af Amer: >60 mL/min (ref >60)
GFR calc non Af Amer: >60 mL/min (ref >60)
Glucose, Bld: 151 mg/dL — ABNORMAL HIGH (ref 70–99)
Potassium: 4.4 mmol/L (ref 3.5–5.1)
Sodium: 140 mmol/L (ref 135–145)
Total Bilirubin: 1.1 mg/dL (ref 0.3–1.2)
Total Protein: 5.8 g/dL — ABNORMAL LOW (ref 6.5–8.1)

## 2019-10-05 LAB — CBC WITH DIFFERENTIAL/PLATELET
Abs Immature Granulocytes: 0.12 10*3/uL — ABNORMAL HIGH (ref 0.00–0.07)
Basophils Absolute: 0 10*3/uL (ref 0.0–0.1)
Basophils Relative: 0 %
Eosinophils Absolute: 0 10*3/uL (ref 0.0–0.5)
Eosinophils Relative: 0 %
HCT: 40.2 % (ref 36.0–46.0)
Hemoglobin: 12.8 g/dL (ref 12.0–15.0)
Immature Granulocytes: 1 %
Lymphocytes Relative: 6 %
Lymphs Abs: 0.5 10*3/uL — ABNORMAL LOW (ref 0.7–4.0)
MCH: 27.6 pg (ref 26.0–34.0)
MCHC: 31.8 g/dL (ref 30.0–36.0)
MCV: 86.6 fL (ref 80.0–100.0)
Monocytes Absolute: 0.5 10*3/uL (ref 0.1–1.0)
Monocytes Relative: 5 %
Neutro Abs: 7.4 10*3/uL (ref 1.7–7.7)
Neutrophils Relative %: 88 %
Platelets: 232 10*3/uL (ref 150–400)
RBC: 4.64 MIL/uL (ref 3.87–5.11)
RDW: 13.3 % (ref 11.5–15.5)
WBC: 8.5 10*3/uL (ref 4.0–10.5)
nRBC: 0 % (ref 0.0–0.2)

## 2019-10-05 LAB — GLUCOSE, CAPILLARY
Glucose-Capillary: 230 mg/dL — ABNORMAL HIGH (ref 70–99)
Glucose-Capillary: 124 mg/dL — ABNORMAL HIGH (ref 70–99)
Glucose-Capillary: 133 mg/dL — ABNORMAL HIGH (ref 70–99)
Glucose-Capillary: 77 mg/dL (ref 70–99)
Glucose-Capillary: 147 mg/dL — ABNORMAL HIGH (ref 70–99)

## 2019-10-05 LAB — CULTURE, BLOOD (ROUTINE X 2)
Culture: NO GROWTH
Culture: NO GROWTH
Special Requests: ADEQUATE

## 2019-10-05 LAB — ECHOCARDIOGRAM LIMITED
Area-P 1/2: 6.12 cm2
Height: 72 in
S' Lateral: 3.3 cm
Weight: 4768.99 oz

## 2019-10-05 LAB — C-REACTIVE PROTEIN: CRP: 1.8 mg/dL — ABNORMAL HIGH (ref <1.0)

## 2019-10-05 LAB — FERRITIN: Ferritin: 228 ng/mL (ref 11–307)

## 2019-10-05 LAB — TROPONIN I (HIGH SENSITIVITY): Troponin I (High Sensitivity): 8 ng/L (ref <18)

## 2019-10-05 LAB — MAGNESIUM: Magnesium: 2.8 mg/dL — ABNORMAL HIGH (ref 1.7–2.4)

## 2019-10-05 LAB — D-DIMER, QUANTITATIVE: D-Dimer, Quant: >20 ug/mL-FEU — ABNORMAL HIGH (ref 0.00–0.50)

## 2019-10-05 MED ORDER — MIDAZOLAM HCL 2 MG/2ML IJ SOLN
INTRAMUSCULAR | Status: AC
  Filled 2019-10-05: qty 4

## 2019-10-05 MED ORDER — ETOMIDATE 2 MG/ML IV SOLN
INTRAVENOUS | Status: AC
  Filled 2019-10-05: qty 20

## 2019-10-05 MED ORDER — HYDROCOD POLST-CPM POLST ER 10-8 MG/5ML PO SUER
5.0000 mL | Freq: Two times a day (BID) | ORAL | Status: DC
  Administered 2019-10-05 – 2019-10-09 (×9): 5 mL via ORAL
  Filled 2019-10-05 (×9): qty 5

## 2019-10-05 MED ORDER — FENTANYL CITRATE (PF) 100 MCG/2ML IJ SOLN
INTRAMUSCULAR | Status: DC
Start: 2019-10-05 — End: 2019-10-06
  Filled 2019-10-05: qty 2

## 2019-10-05 MED ORDER — STERILE WATER FOR INJECTION IJ SOLN
INTRAMUSCULAR | Status: AC
  Filled 2019-10-05: qty 10

## 2019-10-05 MED ORDER — OXYCODONE HCL 5 MG PO TABS
5.0000 mg | ORAL_TABLET | Freq: Four times a day (QID) | ORAL | Status: DC | PRN
  Administered 2019-10-05: 5 mg via ORAL
  Filled 2019-10-05: qty 1

## 2019-10-05 MED ORDER — VECURONIUM BROMIDE 10 MG IV SOLR
INTRAVENOUS | Status: AC
  Filled 2019-10-05: qty 10

## 2019-10-05 MED ORDER — LIDOCAINE HCL (CARDIAC) PF 100 MG/5ML IV SOSY
PREFILLED_SYRINGE | INTRAVENOUS | Status: AC
  Filled 2019-10-05: qty 5

## 2019-10-05 MED ORDER — ROCURONIUM BROMIDE 10 MG/ML (PF) SYRINGE
PREFILLED_SYRINGE | INTRAVENOUS | Status: AC
  Filled 2019-10-05: qty 10

## 2019-10-05 MED ORDER — MORPHINE SULFATE (PF) 2 MG/ML IV SOLN
2.0000 mg | Freq: Four times a day (QID) | INTRAVENOUS | Status: DC | PRN
  Administered 2019-10-05 – 2019-10-06 (×3): 2 mg via INTRAVENOUS
  Filled 2019-10-05 (×5): qty 1

## 2019-10-05 MED ORDER — ENOXAPARIN SODIUM 150 MG/ML ~~LOC~~ SOLN
1.0000 mg/kg | Freq: Two times a day (BID) | SUBCUTANEOUS | Status: DC
  Administered 2019-10-05 – 2019-10-09 (×9): 135 mg via SUBCUTANEOUS
  Filled 2019-10-05 (×9): qty 0.9

## 2019-10-05 MED ORDER — SUCCINYLCHOLINE CHLORIDE 200 MG/10ML IV SOSY
PREFILLED_SYRINGE | INTRAVENOUS | Status: AC
  Filled 2019-10-05: qty 10

## 2019-10-05 MED ORDER — INSULIN DETEMIR 100 UNIT/ML ~~LOC~~ SOLN
10.0000 [IU] | Freq: Two times a day (BID) | SUBCUTANEOUS | Status: DC
  Administered 2019-10-05 – 2019-10-11 (×12): 10 [IU] via SUBCUTANEOUS
  Filled 2019-10-05 (×13): qty 0.1

## 2019-10-05 MED ORDER — PROPOFOL 500 MG/50ML IV EMUL
INTRAVENOUS | Status: AC
  Filled 2019-10-05: qty 50

## 2019-10-05 NOTE — Progress Notes (Signed)
PROGRESS NOTE    Diane Flowers  NFA:[id number] DOB: 1970-09-10 DOA: 09/19/2019 PCP: Maggie Schwalbe, PA-C    Brief Narrative:  49 year old female, unvaccinated against COVID-19 with history of anxiety, myasthenia gravis, pancreatic tumor, thyroid cancer and hypothyroidism symptomatic since 9/9 tested positive for COVID-19 on 9/10.  Was at infusion clinic for monoclonal antibody however found to be hypoxic to 80% on room air and was sent to ER. In the emergency room, febrile temperature 101, heart rate 101, blood pressures 79/62, 92% on 6 L.  Mild elevated AST and ALT.  CTA chest with no pulmonary embolism but extensive bilateral pneumonia. Patient on increased oxygen requirement.   Assessment & Plan:   Principal Problem:   Acute hypoxemic respiratory failure due to COVID-19 [facility]) Active Problems:   Sepsis ([place])   Hypothyroid   Myasthenia gravis ([place])   Positive D dimer  Acute hypoxemic respiratory to COVID-19 virus infection:  Continue to monitor due to significant symptoms, on very high oxygen requirement. chest physiotherapy, incentive spirometry, deep breathing exercises, sputum induction, mucolytic's and bronchodilators. Supplemental oxygen to keep saturations more than 85%. Covid directed therapy with , steroids, high-dose Solu-Medrol remdesivir, finished 5 days of therapy. Baricitinib, Day 5/14 or until hospital discharge. antibiotics, not indicated Due to severity of symptoms, patient will need daily inflammatory markers, chest x-rays, liver function test to monitor and direct COVID-19 therapies. Try intermittent diuresis. D-dimer is significantly elevated today more than 20. Epistaxis improved. Presentation CT of the chest and duplexes were negative for thromboembolism. High risk VTE. Increased Lovenox to therapeutic dose 1 mg/kg twice a day. On very high oxygen to lay flat for CTA, will empirically treat as VTE. 2D echocardiogram to look for right ventricular  dysfunction, lower extremity duplexes. Also followed by pulmonology. COVID-19 Labs  Recent Labs    10/03/19 0308 10/04/19 0257 10/05/19 0300  DDIMER 0.86* 4.59* >20.00*  FERRITIN 455* 253 228  CRP 1.3* 1.0* 1.8*    Lab Results  Component Value Date   SARSCOV2NAA POSITIVE (A) 09/20/2019   SpO2: 90 % O2 Flow Rate (L/min): 30 L/min FiO2 (%): 100 %  Prediabetes/morbid obesity: On semaglutide at home.  Keeping on insulin on high-dose steroids.  Blood sugars elevated.  Keep on long-acting insulin.  Added prandial insulin while on high-dose steroids.  Myasthenia gravis: On pyridostigmine that she will continue.  Hypothyroidism: On replacement with levothyroxine and liothyronine that she will continue.  Abnormal liver function tests: Consistent with acute viral infection.  Levels are stabilizing. Hepatic Function Latest Ref Rng & Units 10/05/2019 10/04/2019 10/03/2019  Total Protein 6.5 - 8.1 g/dL 5.8(L) 5.9(L) 6.2(L)  Albumin 3.5 - 5.0 g/dL 2.8(L) 2.9(L) 2.9(L)  AST 15 - 41 U/L 61(H) 82(H) 111(H)  ALT 0 - 44 U/L 70(H) 85(H) 105(H)  Alk Phosphatase 38 - 126 U/L 64 68 66  Total Bilirubin 0.3 - 1.2 mg/dL 1.1 0.8 0.6   Epistaxis: Related to use of heated high flow.  Stopped with conservative management now.  Use Afrin twice a day for 3 days.  Moisturizing.  Humidifier.  DVT prophylaxis: Lovenox subcu   Code Status: Full code. Patient had previously stated desire not to be intubated however on clear communication, she wished to be intubated if needed to improve from current condition. She does not want to be on a ventilator for long time if no improvement or remains vegetative. Family Communication: Husband on the phone Disposition Plan: Status is: Inpatient  Remains inpatient appropriate because:Inpatient level of care appropriate due  to severity of illness   Dispo: The patient is from: Home              Anticipated d/c is to: Home              Anticipated d/c date is: > 3  days, still on very high flow oxygen.              Patient currently is not medically stable to d/c.    Consultants:   Pulmonary  Procedures:   None  Antimicrobials:  Antibiotics Given (last 72 hours)    Date/Time Action Medication Dose Rate   10/03/19 1028 New Bag/Given   remdesivir 100 mg in sodium chloride 0.9 % 100 mL IVPB 100 mg 200 mL/hr   10/04/19 1136 New Bag/Given   remdesivir 100 mg in sodium chloride 0.9 % 100 mL IVPB 100 mg 200 mL/hr         Subjective: Patient seen and examined.  Feels tired and exhausted.  Remains on high flow oxygen.  No more epistaxis. Denies any chest pain.  No palpitations.  No leg pain or swelling.  Objective: Vitals:   10/05/19 0400 10/05/19 0500 10/05/19 0800 10/05/19 0826  BP: 134/67  133/80   Pulse: (!) 50  64   Resp:   (!) 24   Temp:  98 F (36.7 C) 99.6 F (37.6 C)   TempSrc:  Axillary Oral   SpO2: 95%  (!) 89% 90%  Weight:      Height:        Intake/Output Summary (Last 24 hours) at 10/05/2019 1128 Last data filed at 10/05/2019 0500 Gross per 24 hour  Intake 220 ml  Output 2000 ml  Net -1780 ml   Filed Weights   10/10/2019 1335 10/01/19 1500  Weight: 136.1 kg 135.2 kg    Examination:  General exam: In moderate distress and anxious on 30 L of heated high flow with intermittent nonrebreather in addition to it. Respiratory system: Poor bilateral air entry.  No added sounds. Cardiovascular system: S1 & S2 heard, RRR. No JVD, murmurs, rubs, gallops or clicks. No pedal edema. Gastrointestinal system: Abdomen is nondistended, soft and nontender. No organomegaly or masses felt. Normal bowel sounds heard.  Obese and pendulous. Central nervous system: Alert and oriented. No focal neurological deficits. Extremities: Symmetric 5 x 5 power. Skin: No rashes, lesions or ulcers Psychiatry: Judgement and insight appear normal. Mood & affect anxious.    Data Reviewed: I have personally reviewed following labs and imaging  studies  CBC: Recent Labs  Lab 10/01/19 0615 10/02/19 0243 10/03/19 0308 10/04/19 0257 10/05/19 0300  WBC 2.3* 3.2* 6.8 6.5 8.5  NEUTROABS 1.6* 2.3 5.5 5.5 7.4  HGB 13.2 12.9 13.2 12.5 12.8  HCT 40.7 40.5 41.4 39.5 40.2  MCV 85.7 86.2 86.8 86.6 86.6  PLT 124* 119* 201 212 086   Basic Metabolic Panel: Recent Labs  Lab 10/01/19 0615 10/02/19 0243 10/03/19 0308 10/04/19 0257 10/05/19 0300  NA 138 137 140 141 140  K 4.6 4.5 4.3 4.3 4.4  CL 108 107 107 104 102  CO2 22 21* 22 27 29   GLUCOSE 196* 221* 186* 197* 151*  BUN 12 17 24* 23* 21*  CREATININE 0.73 0.74 0.89 0.81 0.67  CALCIUM 7.4* 7.7* 8.0* 7.7* 7.8*  MG 2.1 2.2 2.5* 2.4 2.8*   GFR: Estimated Creatinine Clearance: 132.9 mL/min (by C-G formula based on SCr of 0.67 mg/dL). Liver Function Tests: Recent Labs  Lab 10/01/19 0615 10/02/19  0349 10/03/19 0308 10/04/19 0257 10/05/19 0300  AST 191* 136* 111* 82* 61*  ALT 158* 122* 105* 85* 70*  ALKPHOS 60 58 66 68 64  BILITOT 0.7 0.5 0.6 0.8 1.1  PROT 6.1* 5.9* 6.2* 5.9* 5.8*  ALBUMIN 2.8* 2.6* 2.9* 2.9* 2.8*   No results for input(s): LIPASE, AMYLASE in the last 168 hours. No results for input(s): AMMONIA in the last 168 hours. Coagulation Profile: No results for input(s): INR, PROTIME in the last 168 hours. Cardiac Enzymes: No results for input(s): CKTOTAL, CKMB, CKMBINDEX, TROPONINI in the last 168 hours. BNP (last 3 results) No results for input(s): PROBNP in the last 8760 hours. HbA1C: No results for input(s): HGBA1C in the last 72 hours. CBG: Recent Labs  Lab 10/04/19 0806 10/04/19 1136 10/04/19 1637 10/04/19 2106 10/05/19 0827  GLUCAP 145* 124* 230* 134* 124*   Lipid Profile: No results for input(s): CHOL, HDL, LDLCALC, TRIG, CHOLHDL, LDLDIRECT in the last 72 hours. Thyroid Function Tests: No results for input(s): TSH, T4TOTAL, FREET4, T3FREE, THYROIDAB in the last 72 hours. Anemia Panel: Recent Labs    10/04/19 0257 10/05/19 0300    FERRITIN 253 228   Sepsis Labs: Recent Labs  Lab 09/29/2019 1214 09/17/2019 1237 09/16/2019 1437  PROCALCITON  --  <0.10  --   LATICACIDVEN 1.1  --  1.3    Recent Results (from the past 240 hour(s))  Blood Culture (routine x 2)     Status: None (Preliminary result)   Collection Time: 09/16/2019 12:14 PM   Specimen: BLOOD  Result Value Ref Range Status   Specimen Description   Final    BLOOD LEFT ANTECUBITAL Performed at [facility] [address], [place], [place] [address]    Special Requests   Final    BOTTLES DRAWN AEROBIC AND ANAEROBIC Blood Culture adequate volume Performed at [facility] [address] [place], [place] [address]    Culture   Final    NO GROWTH 4 DAYS Performed at [facility], [place] [address] [place], [place] [address]    Report Status PENDING  Incomplete  Blood Culture (routine x 2)     Status: None (Preliminary result)   Collection Time: 10/11/2019  1:56 PM   Specimen: BLOOD  Result Value Ref Range Status   Specimen Description BLOOD SITE NOT SPECIFIED  Final   Special Requests   Final    BOTTLES DRAWN AEROBIC AND ANAEROBIC Blood Culture results may not be optimal due to an inadequate volume of blood received in culture bottles   Culture   Final    NO GROWTH 4 DAYS Performed at [facility], [place] [address] [place], [place] [address]    Report Status PENDING  Incomplete  SARS Coronavirus 2 by RT PCR (hospital order, performed in [facility] hospital lab) Nasopharyngeal Nasopharyngeal Swab     Status: Abnormal   Collection Time: 10/10/2019  2:01 PM   Specimen: Nasopharyngeal Swab  Result Value Ref Range Status   SARS Coronavirus 2 POSITIVE (A) NEGATIVE Final    Comment: RESULT CALLED TO, READ BACK BY AND VERIFIED WITH: WEST,K. RN @1513  ON 09.16.2021 BY COHEN,K Performed at [facility], [place] [address], [place], [place] [address]   MRSA PCR Screening     Status: None   Collection  Time: 10/01/19  4:05 PM   Specimen: Nasal Mucosa; Nasopharyngeal  Result Value Ref Range Status   MRSA by PCR NEGATIVE NEGATIVE Final  Comment:        The GeneXpert MRSA Assay (FDA approved for NASAL specimens only), is one component of a comprehensive MRSA colonization surveillance program. It is not intended to diagnose MRSA infection nor to guide or monitor treatment for MRSA infections. Performed at [facility], [place], [place] [address], [place], [place] [address]          Radiology Studies: DG CHEST PORT 1 VIEW  Result Date: 10/04/2019 CLINICAL DATA:  Cough and shortness of breath.  COVID-19 positive EXAM: PORTABLE CHEST 1 VIEW COMPARISON:  Chest radiograph and chest CT September 30, 2019 FINDINGS: There is overall less airspace opacity bilaterally compared to recent studies. Airspace opacity does remain in the mid and lower lung regions to a lesser degree. No new opacity evident. Heart is mildly enlarged with pulmonary vascularity normal. No adenopathy no bone lesions. IMPRESSION: Multifocal pneumonia remains although less prominent than on recent studies. Stable cardiac prominence. No adenopathy evident. Electronically Signed   By: Lowella Grip III M.D.   On: 10/04/2019 10:24        Scheduled Meds: . aspirin EC  81 mg Oral Daily  . baricitinib  4 mg Oral Daily  . Chlorhexidine Gluconate Cloth  6 each Topical Daily  . enoxaparin (LOVENOX) injection  1 mg/kg Subcutaneous Q12H  . insulin aspart  0-20 Units Subcutaneous TID WC  . insulin aspart  4 Units Subcutaneous TID WC  . insulin detemir  0.15 Units/kg Subcutaneous BID  . Ipratropium-Albuterol  1 puff Inhalation Q6H  . levothyroxine  175 mcg Oral QAC breakfast  . linagliptin  5 mg Oral Daily  . liothyronine  5 mcg Oral Daily  . mouth rinse  15 mL Mouth Rinse BID  . methylPREDNISolone (SOLU-MEDROL) injection  60 mg Intravenous Q12H  . montelukast  10 mg Oral Daily  . oxybutynin  10 mg Oral Daily    . oxymetazoline  1 spray Each Nare BID  . pantoprazole  40 mg Oral Daily  . pyridostigmine  60 mg Oral QID   Continuous Infusions: . sodium chloride Stopped (10/05/2019 1632)     LOS: 5 days    Time spent: 30 minutes    Barb Merino, MD [facility] Pager [phone]

## 2019-10-05 NOTE — Progress Notes (Signed)
NAME:  Diane Flowers, MRN:  [id number], DOB:  September 07, 1970, LOS: 5 ADMISSION DATE:  09/28/2019, CONSULTATION DATE: 10/04/19 REFERRING MD: Dr. Sloan Leiter, CHIEF COMPLAINT: Epistaxis   Brief History   [name], unvaccinated female (prior hx of anaphylaxis with flu vaccine), admitted 9/16 with hypoxic respiratory failure in the setting of COVID PNA.  Course complicated by epistaxis.   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 at [facility]  Uterine Mass  Hypothyroidism  Splenic artery aneurysm Hiatal hernia  Vitamin D Deficiency  GERD  Significant Hospital Events   9/16 Admit  9/20 PCCM consulted for epistaxis, hypoxic respiratory failure in setting of COVID PNA  Consults:    Procedures:    Significant Diagnostic Tests:   CT Angio Chest 9/16 >> no central pulmonary embolism, extensive multifocal patchy airspace opacities   LE Vascular Duplex 9/17 >> negative for DVT bilaterally  Micro Data:  COVID 9/16 >> positive   Antimicrobials:    Interim history/subjective:  Pt reports she is scared and does not want to go on the ventilator unless absolutely necessary  Afebrile  No further nose bleeding overnight    Objective   Blood pressure 134/67, pulse (!) 50, temperature 98 F (36.7 C), temperature source Axillary, resp. rate (!) 27, height 6' (1.829 m), weight 135.2 kg, SpO2 90 %.    FiO2 (%):  [100 %] 100 %   Intake/Output Summary (Last 24 hours) at 10/05/2019 0840 Last data filed at 10/05/2019 0500 Gross per 24 hour  Intake 220 ml  Output 2000 ml  Net -1780 ml   Filed Weights   09/19/2019 1335 10/01/19 1500  Weight: 136.1 kg 135.2 kg    Examination: General: adult female sitting up in chair in NAD eating  breakfast HEENT: MM pink/moist, [place] O2 + NRB in place, anicteric  Neuro: AAOx4, speech clear, MAE, normal strength  CV: s1s2 rrr, no m/r/g PULM: non-labored on heated high flow oxygen + NRB, lungs bilaterally diminished  GI: soft, bsx4 active  Extremities: warm/dry, no edema  Skin: no rashes or lesions  Resolved Hospital Problem list     Assessment & Plan:   Acute Hypoxic Respiratory Failure in setting of COVID PNA Epistaxis secondary to HF Oxygen Completed remdesivir  -continue solumedrol 60 mg IV Q12, baricitinib  -mobilize out of bed as tolerated  -prone positioning as tolerated -follow for recurrent epistaxis  -threshold for intubation would be change in mental status, significant epistaxis or increased work of breathing / respiratory fatigue -continue afrin for epistaxis   Elevated D-Dimer  Note significant rise in d-dimer in last 24 hours. No significant change in O2 needs or work of breathing. Low Well's score but agree she is at risk for PE/DVT with inflammation from COVID and bedrest.  LE duplex negative for DVT 9/17.   -doubt she can safely travel to CT for angio of chest  -assess troponin, if negative doubt PE -if concern for PE may have to empirically treat with IV heparin, have discussed with patient  Steroid induced hypoglycemia -continue SSI, linagliptin   Best practice:  Diet: as tolerated, aspiration precautions Pain/Anxiety/Delirium protocol (if indicated): n/a  VAP protocol (if indicated): n/a  DVT prophylaxis: enoxaparin  GI prophylaxis: PPI on high dose steroids  Glucose control: SSI  Mobility:  as tolerated  Code Status: Full Code  Family Communication: per primary  Disposition: SDU/ICU  Labs   CBC: Recent Labs  Lab 10/01/19 0615 10/02/19 0243 10/03/19 0308 10/04/19 0257 10/05/19 0300  WBC 2.3* 3.2* 6.8 6.5 8.5  NEUTROABS 1.6* 2.3 5.5 5.5 7.4  HGB 13.2 12.9 13.2 12.5 12.8  HCT 40.7 40.5 41.4 39.5 40.2  MCV 85.7 86.2 86.8 86.6 86.6  PLT  124* 119* 201 212 244    Basic Metabolic Panel: Recent Labs  Lab 10/01/19 0615 10/02/19 0243 10/03/19 0308 10/04/19 0257 10/05/19 0300  NA 138 137 140 141 140  K 4.6 4.5 4.3 4.3 4.4  CL 108 107 107 104 102  CO2 22 21* _0 GLUCOSE 196* 221* 186* 197* 151*  BUN 12 17 24* 23* 21*  CREATININE 0.73 0.74 0.89 0.81 0.67  CALCIUM 7.4* 7.7* 8.0* 7.7* 7.8*  MG 2.1 2.2 2.5* 2.4 2.8*   GFR: Estimated Creatinine Clearance: 132.9 mL/min (by C-G formula based on SCr of 0.67 mg/dL). Recent Labs  Lab 09/19/2019 1214 09/16/2019 1237 10/10/2019 1437 10/01/19 0615 10/02/19 0243 10/03/19 0308 10/04/19 0257 10/05/19 0300  PROCALCITON  --  <0.10  --   --   --   --   --   --   WBC  --  2.7*  --    < > 3.2* 6.8 6.5 8.5  LATICACIDVEN 1.1  --  1.3  --   --   --   --   --    < > = values in this interval not displayed.    Liver Function Tests: Recent Labs  Lab 10/01/19 0615 10/02/19 0243 10/03/19 0308 10/04/19 0257 10/05/19 0300  AST 191* 136* 111* 82* 61*  ALT 158* 122* 105* 85* 70*  ALKPHOS 60 58 66 68 64  BILITOT 0.7 0.5 0.6 0.8 1.1  PROT 6.1* 5.9* 6.2* 5.9* 5.8*  ALBUMIN 2.8* 2.6* 2.9* 2.9* 2.8*   No results for input(s): LIPASE, AMYLASE in the last 168 hours. No results for input(s): AMMONIA in the last 168 hours.  ABG No results found for: PHART, PCO2ART, PO2ART, HCO3, TCO2, ACIDBASEDEF, O2SAT   Coagulation Profile: No results for input(s): INR, PROTIME in the last 168 hours.  Cardiac Enzymes: No results for input(s): CKTOTAL, CKMB, CKMBINDEX, TROPONINI in the last 168 hours.  HbA1C: No results found for: HGBA1C  CBG: Recent Labs  Lab 10/04/19 0806 10/04/19 1136 10/04/19 1637 10/04/19 2106 10/05/19 0827  GLUCAP 145* 124* 230* 134* 124*     Critical care time:    Noe Gens, MSN, NP-C [facility] 10/05/2019, 8:41 AM   Please see [email or web address] for pager details.

## 2019-10-05 NOTE — Progress Notes (Signed)
PCCM progress note  Chest x-ray reviewed with pneumomediastinum.  There is no evidence of pneumothorax Supportive care Follow daily chest x-ray Cough suppression with Tessalon, Robitussin Add Tussionex.  Marshell Garfinkel MD [facility] Please see [email or web address] for pager details.  10/05/2019, 3:56 PM

## 2019-10-05 NOTE — Progress Notes (Signed)
eLink Physician-Brief Progress Note Patient Name: Diane Flowers DOB: [birth year] MRN: [id number]   Date of Service  10/05/2019  HPI/Events of Note  Request for bedside assessment on possible intubation, concern about tiring out On camera assessment, patient seen on heated HFNC 30L 100% and 100% NRB mask. O2 sat 89% Not in acute distress but tachypneic  eICU Interventions  Informed bedside CCM team to assess but if patient needs urgent intubation bedside team is to reach out to anesthesia     Intervention Category Major Interventions: Respiratory failure - evaluation and management  Judd Lien 10/05/2019, 10:35 PM

## 2019-10-05 NOTE — Progress Notes (Signed)
  Echocardiogram 2D Echocardiogram has been performed.  Matilde Bash 10/05/2019, 11:14 AM

## 2019-10-06 ENCOUNTER — Inpatient Hospital Stay (HOSPITAL_COMMUNITY): Payer: Commercial Managed Care - PPO

## 2019-10-06 DIAGNOSIS — Z515 Encounter for palliative care: Secondary | ICD-10-CM

## 2019-10-06 DIAGNOSIS — I82403 Acute embolism and thrombosis of unspecified deep veins of lower extremity, bilateral: Secondary | ICD-10-CM

## 2019-10-06 DIAGNOSIS — J1282 Pneumonia due to coronavirus disease 2019: Secondary | ICD-10-CM

## 2019-10-06 DIAGNOSIS — U071 COVID-19: Secondary | ICD-10-CM

## 2019-10-06 DIAGNOSIS — Z7189 Other specified counseling: Secondary | ICD-10-CM

## 2019-10-06 LAB — COMPREHENSIVE METABOLIC PANEL
ALT: 57 U/L — ABNORMAL HIGH (ref 0–44)
AST: 49 U/L — ABNORMAL HIGH (ref 15–41)
Albumin: 2.8 g/dL — ABNORMAL LOW (ref 3.5–5.0)
Alkaline Phosphatase: 64 U/L (ref 38–126)
Anion gap: 8 (ref 5–15)
BUN: 18 mg/dL (ref 6–20)
CO2: 27 mmol/L (ref 22–32)
Calcium: 7.5 mg/dL — ABNORMAL LOW (ref 8.9–10.3)
Chloride: 99 mmol/L (ref 98–111)
Creatinine, Ser: 0.75 mg/dL (ref 0.44–1.00)
GFR calc Af Amer: >60 mL/min (ref >60)
GFR calc non Af Amer: >60 mL/min (ref >60)
Glucose, Bld: 186 mg/dL — ABNORMAL HIGH (ref 70–99)
Potassium: 4.4 mmol/L (ref 3.5–5.1)
Sodium: 134 mmol/L — ABNORMAL LOW (ref 135–145)
Total Bilirubin: 1.5 mg/dL — ABNORMAL HIGH (ref 0.3–1.2)
Total Protein: 5.9 g/dL — ABNORMAL LOW (ref 6.5–8.1)

## 2019-10-06 LAB — CBC WITH DIFFERENTIAL/PLATELET
Abs Immature Granulocytes: 0.17 10*3/uL — ABNORMAL HIGH (ref 0.00–0.07)
Basophils Absolute: 0 10*3/uL (ref 0.0–0.1)
Basophils Relative: 0 %
Eosinophils Absolute: 0 10*3/uL (ref 0.0–0.5)
Eosinophils Relative: 0 %
HCT: 40.5 % (ref 36.0–46.0)
Hemoglobin: 13.2 g/dL (ref 12.0–15.0)
Immature Granulocytes: 1 %
Lymphocytes Relative: 2 %
Lymphs Abs: 0.3 10*3/uL — ABNORMAL LOW (ref 0.7–4.0)
MCH: 28.1 pg (ref 26.0–34.0)
MCHC: 32.6 g/dL (ref 30.0–36.0)
MCV: 86.2 fL (ref 80.0–100.0)
Monocytes Absolute: 0.4 10*3/uL (ref 0.1–1.0)
Monocytes Relative: 3 %
Neutro Abs: 15.3 10*3/uL — ABNORMAL HIGH (ref 1.7–7.7)
Neutrophils Relative %: 94 %
Platelets: 273 10*3/uL (ref 150–400)
RBC: 4.7 MIL/uL (ref 3.87–5.11)
RDW: 13.3 % (ref 11.5–15.5)
WBC: 16.2 10*3/uL — ABNORMAL HIGH (ref 4.0–10.5)
nRBC: 0 % (ref 0.0–0.2)

## 2019-10-06 LAB — GLUCOSE, CAPILLARY
Glucose-Capillary: 164 mg/dL — ABNORMAL HIGH (ref 70–99)
Glucose-Capillary: 116 mg/dL — ABNORMAL HIGH (ref 70–99)
Glucose-Capillary: 194 mg/dL — ABNORMAL HIGH (ref 70–99)
Glucose-Capillary: 182 mg/dL — ABNORMAL HIGH (ref 70–99)

## 2019-10-06 LAB — MAGNESIUM: Magnesium: 2.5 mg/dL — ABNORMAL HIGH (ref 1.7–2.4)

## 2019-10-06 LAB — D-DIMER, QUANTITATIVE: D-Dimer, Quant: 11.62 ug/mL-FEU — ABNORMAL HIGH (ref 0.00–0.50)

## 2019-10-06 MED ORDER — MORPHINE SULFATE (PF) 2 MG/ML IV SOLN
2.0000 mg | INTRAVENOUS | Status: DC | PRN
  Administered 2019-10-06 – 2019-10-08 (×16): 2 mg via INTRAVENOUS
  Filled 2019-10-06 (×15): qty 1

## 2019-10-06 MED ORDER — FUROSEMIDE 10 MG/ML IJ SOLN
40.0000 mg | Freq: Once | INTRAMUSCULAR | Status: AC
  Administered 2019-10-06: 40 mg via INTRAVENOUS
  Filled 2019-10-06: qty 4

## 2019-10-06 NOTE — Progress Notes (Signed)
Called to assess patient for intubation.  Patient alert oriented.   RR in 53s.  Oxygen saturation in mid 80s.  Increased Flow rate on HFNC to 45L.   Hold off on intubation for now, given mental status, only mildly tachypneic.  Stable.   Monitor mental status, oxygen saturation, and respiratory rate closely.   CRNA in house also to evaluate patient in preparation for high risk for intubation at some point in the near future.

## 2019-10-06 NOTE — Progress Notes (Addendum)
PROGRESS NOTE    Diane Flowers  XTG:[id number] DOB: 06/07/1970 DOA: 10/06/2019 PCP: Maggie Schwalbe, PA-C    Brief Narrative:  Patient admitted to the hospital with a working diagnosis of acute hypoxic respiratory failure due to SARS COVID-19 viral pneumonia.  49 year old female with past medical history for myasthenia gravis, splenic artery aneurysm, thyroid cancer/hypothyroidism, prediabetes and history of pancreatic tumor who presented with fevers, chills, loss of taste and cough. She was diagnosed with SARS COVID-19 on 09/24/19. On 9/15 she developed hypoxemia down to 80s. She initially was seen at the outpatient infusion clinic, supposed to get monoclonal antibodies, but due to hypoxemia she was referred to the hospital. On her initial physical examination she was febrile 101 F, tachycardic 101 bpm, tachypneic 25 breaths/min, her blood pressure was 79/62, 136/79, oxygenation 92% on 6 L per nasal cannula. Her lungs had no wheezing, heart S1-S2, present tachycardic, abdomen was soft, no lower extremity edema. Positive right leg tenderness. Her chest radiograph had diffuse, bilateral essential infiltrates, correlated with CT chest.  Patient has been treated with systemic corticosteroids, remdesivir and perceived need. She had worsening hypoxemia, and development of pneumomediastinum.  Very high elevated D-dimer, she has been placed on full anticoagulation. Ultrasonography of the lower extremities with acute deep vein thrombosis involving the right posterior tibial vein, and acute deep vein thrombosis involving the left peroneal veins.  Currently patient on heated high flow nasal cannula plus nonrebreather mask, FiO2 100%, 50 L/min flow.  Assessment & Plan:   Principal Problem:   Acute hypoxemic respiratory failure due to COVID-19 [facility]) Active Problems:   Hypothyroid   Myasthenia gravis ([place])   DVT of lower extremity, bilateral (HCC)   Pneumonia due to COVID-19 virus  *sepsis ruled  out, signs and symptoms due to SARS COVID 19 viral pneumonia.   1. Acute hypoxic respiratory failure due to SARS COVID-19 viral pneumonia/ ARDS/ pneumomediastinum. #5 remdesivir.   RR: 28  Pulse oxymetry:81 to 86% Fi02:  50 L/min 100% HFNC plus NRBM  COVID-19 Labs  Recent Labs    10/04/19 0257 10/05/19 0300 10/06/19 0238  DDIMER 4.59* >20.00* 11.62*  FERRITIN 253 228  --   CRP 1.0* 1.8*  --     Lab Results  Component Value Date   SARSCOV2NAA POSITIVE (A) 10/10/2019    Patient reports persistent dyspnea and moderate fatigue, mild somnolence. Her daughter is at the bedside.  Chest film with persistent small pneumomediastinum, and has diffuse bilateral interstitial infiltrates.   Continue medical therapy with baricitinib #6, high dose methylprednisolone 60 mg Iv q12 H, bronchodilator therapy, airway clearing techniques, bronchodilator therapy and antitussive agents.   Patient is aware of worsening hypoxemia, she may need mechanical ventilation if continues to deteriorate. Certainly positive pressure ventilation can worsen pneumomediastinum.  For now continue aggressive supportive care, encourage left lateral decubitus position, due to pain on the right side she can stay prone.  Will give a trail of diuresis with furosemide 40 mg IV for suspected non cardiogenic pulmonary edema. Continue anticoagulation for DVT, at this point her pre-test probability for PE is very high.   Increase frequency of morphine for right sided pain. Continue close monitoring of inflammatory markers.   2. Bilateral acute lower extremity DVT. Continue with full anticoagulation with enoxaparin.   3. Myasthenia gravis. Difficult to assess inspiratory force due to current acute viral illness and deconditioning. Continue with pyridostigmine.   4. Prediabetes and obesity class 3. Glucose has been controlled, fasting this am at 186, will continue  insulin sliding scale and basal insulin with levimir 10  units. Continue with linagliptine.    5. Hypothyroid. Continue with levothyroxine. Liothyronine.   6. Perineum pressure ulcer. Unable to stage, present on admission. Continue local skin care.   Patient continue to be at high risk for  Worsening respiratory failure.   Status is: Inpatient  Remains inpatient appropriate because:IV treatments appropriate due to intensity of illness or inability to take PO   Dispo:  Patient From: Home  Planned Disposition: Home with Health Care Svc  Expected discharge date: 10/08/19  Medically stable for discharge: No   DVT prophylaxis: Enoxaparin   Code Status:   full  Family Communication:  I spoke with patient's daughter at the bedside, we talked in detail about patient's condition, plan of care and prognosis and all questions were addressed.      Nutrition Status:           Skin Documentation: Pressure Injury 10/01/19 Perineum Medial MASD (Active)  10/01/19 2100  Location: Perineum  Location Orientation: Medial  Staging:   Wound Description (Comments): MASD  Present on Admission: Yes     Consultants:   Pulmonary     Subjective: Patient continue to have fatigue moderate in intensity and persistent dyspnea and cough. Positive pain at the right neck and chest, no nausea or vomiting.   Objective: Vitals:   10/06/19 0600 10/06/19 0700 10/06/19 0800 10/06/19 0845  BP: 125/64 122/63 139/67   Pulse: 86 85 92   Resp: (!) 27 (!) 21 (!) 28   Temp:   99.6 F (37.6 C)   TempSrc:   Oral   SpO2: (!) 83% (!) 75% (!) 81% (!) 86%  Weight:      Height:        Intake/Output Summary (Last 24 hours) at 10/06/2019 0854 Last data filed at 10/06/2019 0600 Gross per 24 hour  Intake 180 ml  Output --  Net 180 ml   Filed Weights   09/20/2019 1335 10/01/19 1500  Weight: 136.1 kg 135.2 kg    Examination:   General: deconditioned and ill looking appearing  Neurology: Awake and alert, non focal  E ENT: positive pallor, no icterus,  oral mucosa moist Cardiovascular: No JVD. S1-S2 present, rhythmic, Positive ++ non pitting bilateral lower extremity edema.  Pulmonary: positive bilateral breath sounds, with no wheezing.  Gastrointestinal. Abdomen protuberant but non tender Skin. No rashes Musculoskeletal: no joint deformities     Data Reviewed: I have personally reviewed following labs and imaging studies  CBC: Recent Labs  Lab 10/02/19 0243 10/03/19 0308 10/04/19 0257 10/05/19 0300 10/06/19 0238  WBC 3.2* 6.8 6.5 8.5 16.2*  NEUTROABS 2.3 5.5 5.5 7.4 15.3*  HGB 12.9 13.2 12.5 12.8 13.2  HCT 40.5 41.4 39.5 40.2 40.5  MCV 86.2 86.8 86.6 86.6 86.2  PLT 119* 201 212 232 269   Basic Metabolic Panel: Recent Labs  Lab 10/02/19 0243 10/03/19 0308 10/04/19 0257 10/05/19 0300 10/06/19 0238  NA 137 140 141 140 134*  K 4.5 4.3 4.3 4.4 4.4  CL 107 107 104 102 99  CO2 21* 22 27 29 27   GLUCOSE 221* 186* 197* 151* 186*  BUN 17 24* 23* 21* 18  CREATININE 0.74 0.89 0.81 0.67 0.75  CALCIUM 7.7* 8.0* 7.7* 7.8* 7.5*  MG 2.2 2.5* 2.4 2.8* 2.5*   GFR: Estimated Creatinine Clearance: 132.9 mL/min (by C-G formula based on SCr of 0.75 mg/dL). Liver Function Tests: Recent Labs  Lab 10/02/19 0243 10/03/19 0308 10/04/19  0257 10/05/19 0300 10/06/19 0238  AST 136* 111* 82* 61* 49*  ALT 122* 105* 85* 70* 57*  ALKPHOS 58 66 68 64 64  BILITOT 0.5 0.6 0.8 1.1 1.5*  PROT 5.9* 6.2* 5.9* 5.8* 5.9*  ALBUMIN 2.6* 2.9* 2.9* 2.8* 2.8*   No results for input(s): LIPASE, AMYLASE in the last 168 hours. No results for input(s): AMMONIA in the last 168 hours. Coagulation Profile: No results for input(s): INR, PROTIME in the last 168 hours. Cardiac Enzymes: No results for input(s): CKTOTAL, CKMB, CKMBINDEX, TROPONINI in the last 168 hours. BNP (last 3 results) No results for input(s): PROBNP in the last 8760 hours. HbA1C: No results for input(s): HGBA1C in the last 72 hours. CBG: Recent Labs  Lab 10/05/19 0827  10/05/19 1134 10/05/19 1639 10/05/19 2016 10/06/19 0753  GLUCAP 124* 133* 77 147* 164*   Lipid Profile: No results for input(s): CHOL, HDL, LDLCALC, TRIG, CHOLHDL, LDLDIRECT in the last 72 hours. Thyroid Function Tests: No results for input(s): TSH, T4TOTAL, FREET4, T3FREE, THYROIDAB in the last 72 hours. Anemia Panel: Recent Labs    10/04/19 0257 10/05/19 0300  FERRITIN 253 228      Radiology Studies: I have reviewed all of the imaging during this hospital visit personally     Scheduled Meds: . aspirin EC  81 mg Oral Daily  . baricitinib  4 mg Oral Daily  . Chlorhexidine Gluconate Cloth  6 each Topical Daily  . chlorpheniramine-HYDROcodone  5 mL Oral Q12H  . enoxaparin (LOVENOX) injection  1 mg/kg Subcutaneous Q12H  . insulin aspart  0-20 Units Subcutaneous TID WC  . insulin detemir  10 Units Subcutaneous BID  . Ipratropium-Albuterol  1 puff Inhalation Q6H  . levothyroxine  175 mcg Oral QAC breakfast  . linagliptin  5 mg Oral Daily  . liothyronine  5 mcg Oral Daily  . mouth rinse  15 mL Mouth Rinse BID  . methylPREDNISolone (SOLU-MEDROL) injection  60 mg Intravenous Q12H  . montelukast  10 mg Oral Daily  . oxybutynin  10 mg Oral Daily  . oxymetazoline  1 spray Each Nare BID  . pantoprazole  40 mg Oral Daily  . pyridostigmine  60 mg Oral QID   Continuous Infusions: . sodium chloride Stopped (09/29/2019 1632)     LOS: 6 days        Juliona Vales Gerome Apley, MD

## 2019-10-06 NOTE — Progress Notes (Signed)
NAME:  Diane Flowers, MRN:  [id number], DOB:  07/28/1970, LOS: 6 ADMISSION DATE:  09/29/2019, CONSULTATION DATE: 10/04/19 REFERRING MD: Dr. Sloan Leiter, CHIEF COMPLAINT: Epistaxis   Brief History   [name], unvaccinated female (prior hx of anaphylaxis with flu vaccine), admitted 9/16 with hypoxic respiratory failure in the setting of COVID PNA.  Course complicated by epistaxis.   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  Uterine Mass  Hypothyroidism  Splenic artery aneurysm Hiatal hernia  Vitamin D Deficiency  GERD  Significant Hospital Events   9/16 Admit  9/20 PCCM consulted for epistaxis, hypoxic respiratory failure in setting of COVID PNA 9/21 Evaluated by night doc for intubation but deferred  Consults:  PCCM  Procedures:    Significant Diagnostic Tests:   CT Angio Chest 9/16 >> no central pulmonary embolism, extensive multifocal patchy airspace opacities   LE Vascular Duplex 9/17 >> negative for DVT bilaterally  LE Vascular Duplex 9/21 >> B/L DVT  Micro Data:  COVID 9/16 >> positive   Antimicrobials/COVID Rx  Baricitinib 9/17 >> Solu-Medrol 9/17 >> Remdesivir 9/16- 9/20  Interim history/subjective:   Remains on high flow nasal cannula, tachypneic  Objective   Blood pressure 125/64, pulse 86, temperature (!) 100.5 F (38.1 C), temperature source Axillary, resp. rate (!) 27, height 6' (1.829 m), weight 135.2 kg, SpO2 (!) 83 %.    FiO2 (%):  [100 %] 100 %   Intake/Output Summary (Last 24 hours) at 10/06/2019 0759 Last data filed at 10/06/2019 0600 Gross per 24 hour  Intake 180 ml  Output --  Net 180 ml   Filed Weights   09/27/2019 1335 10/01/19 1500  Weight: 136.1 kg 135.2 kg     Examination: Gen:      No acute distress HEENT:  EOMI, sclera anicteric Neck:     No masses; no thyromegaly Lungs:    Clear to auscultation bilaterally; normal respiratory effort CV:         Regular rate and rhythm; no murmurs Abd:      + bowel sounds; soft, non-tender; no palpable masses, no distension Ext:    No edema; adequate peripheral perfusion Skin:      Warm and dry; no rash Neuro: Awake, responsive  Labs, imaging reviewed BUN/creatinine 18/0.75 WBC 16.2, platelets 273 Chest x-ray 9/22-stable pneumomediastinum, bilateral opacities.  No pneumothorax.  Resolved Hospital Problem list   Epistaxis  Assessment & Plan:   Acute Hypoxic Respiratory Failure in setting of COVID PNA Epistaxis secondary to HF Oxygen Finished remdesivir Continue Solu-Medrol, baricitinib Prone positioning as tolerated Continue Afrin as needed for epistaxis Does not need intubation at present we will continue to monitor closely   Elevated D-dimer with DVT Possible PE Continue full dose Lovenox  Pneumomediastinum, no pneumothorax Daily chest x-ray. Cough suppression with Tussionex, tessalon,   Steroid induced hypoglycemia Continue SSI, linagliptin   Hypothyroidism Continue Synthyroid  Goals of care Daughters at bedside and we had a goals of care discussion patient was awake and alert able to participate Discussed her clinical status She does not require intubation at present but is at high risk for decompensation. Discussed DNR status.  Patient and family and not ready to make that decision   Best practice:  Diet: as tolerated, aspiration precautions  Pain/Anxiety/Delirium protocol (if indicated): n/a  VAP protocol (if indicated): n/a  DVT prophylaxis: enoxaparin  GI prophylaxis: PPI on high dose steroids  Glucose control: SSI  Mobility: as tolerated  Code Status: Full Code  Family Communication: See above Disposition: SDU/ICU  Critical care time:    The patient is  critically ill with multiple organ system failure and requires high complexity decision making for assessment and support, frequent evaluation and titration of therapies, advanced monitoring, review of radiographic studies and interpretation of complex data.   Critical Care Time devoted to patient care services, exclusive of separately billable procedures, described in this note is 35 minutes.   Marshell Garfinkel MD  Pulmonary and Critical Care Please see [email or web address] for pager details.  10/06/2019, 8:10 AM

## 2019-10-06 NOTE — Progress Notes (Signed)
Spoke with patient's daughter and patient wishes to be DNI. Patient does not want DNR at this time. Palliative consulted, MD Arrien notified.

## 2019-10-06 NOTE — TOC Progression Note (Signed)
Transition of Care [facility]) - Progression Note    Patient Details  Name: Diane Flowers MRN: [id number] Date of Birth: 1970-10-08  Transition of Care [facility]) CM/SW Contact  Leeroy Cha, RN Phone Number: 10/06/2019, 10:17 AM  Clinical Narrative:    hfnrb at 50l/min, iv solu medrol/d dimer 11.62, wbc 16.2 Following for progression and toc needs.   Expected Discharge Plan: Home/Self Care Barriers to Discharge: Continued Medical Work up  Expected Discharge Plan and Services Expected Discharge Plan: Home/Self Care   Discharge Planning Services: CM Consult   Living arrangements for the past 2 months: Single Family Home                                       Social Determinants of Health (SDOH) Interventions    Readmission Risk Interventions No flowsheet data found.

## 2019-10-07 ENCOUNTER — Inpatient Hospital Stay (HOSPITAL_COMMUNITY): Payer: Commercial Managed Care - PPO

## 2019-10-07 DIAGNOSIS — J1282 Pneumonia due to coronavirus disease 2019: Secondary | ICD-10-CM

## 2019-10-07 DIAGNOSIS — J982 Interstitial emphysema: Secondary | ICD-10-CM

## 2019-10-07 DIAGNOSIS — R0902 Hypoxemia: Secondary | ICD-10-CM

## 2019-10-07 LAB — COMPREHENSIVE METABOLIC PANEL
ALT: 47 U/L — ABNORMAL HIGH (ref 0–44)
AST: 42 U/L — ABNORMAL HIGH (ref 15–41)
Albumin: 2.8 g/dL — ABNORMAL LOW (ref 3.5–5.0)
Alkaline Phosphatase: 63 U/L (ref 38–126)
Anion gap: 11 (ref 5–15)
BUN: 20 mg/dL (ref 6–20)
CO2: 29 mmol/L (ref 22–32)
Calcium: 7.9 mg/dL — ABNORMAL LOW (ref 8.9–10.3)
Chloride: 96 mmol/L — ABNORMAL LOW (ref 98–111)
Creatinine, Ser: 0.69 mg/dL (ref 0.44–1.00)
GFR calc Af Amer: >60 mL/min (ref >60)
GFR calc non Af Amer: >60 mL/min (ref >60)
Glucose, Bld: 170 mg/dL — ABNORMAL HIGH (ref 70–99)
Potassium: 4.2 mmol/L (ref 3.5–5.1)
Sodium: 136 mmol/L (ref 135–145)
Total Bilirubin: 1.2 mg/dL (ref 0.3–1.2)
Total Protein: 6.2 g/dL — ABNORMAL LOW (ref 6.5–8.1)

## 2019-10-07 LAB — GLUCOSE, CAPILLARY
Glucose-Capillary: 220 mg/dL — ABNORMAL HIGH (ref 70–99)
Glucose-Capillary: 201 mg/dL — ABNORMAL HIGH (ref 70–99)
Glucose-Capillary: 173 mg/dL — ABNORMAL HIGH (ref 70–99)
Glucose-Capillary: 212 mg/dL — ABNORMAL HIGH (ref 70–99)

## 2019-10-07 LAB — D-DIMER, QUANTITATIVE: D-Dimer, Quant: 6.2 ug/mL-FEU — ABNORMAL HIGH (ref 0.00–0.50)

## 2019-10-07 LAB — C-REACTIVE PROTEIN: CRP: 21 mg/dL — ABNORMAL HIGH (ref <1.0)

## 2019-10-07 LAB — FERRITIN: Ferritin: 345 ng/mL — ABNORMAL HIGH (ref 11–307)

## 2019-10-07 MED ORDER — METHYLPREDNISOLONE SODIUM SUCC 125 MG IJ SOLR
60.0000 mg | Freq: Three times a day (TID) | INTRAMUSCULAR | Status: DC
  Administered 2019-10-07 – 2019-10-11 (×12): 60 mg via INTRAVENOUS
  Filled 2019-10-07 (×12): qty 2

## 2019-10-07 NOTE — Consult Note (Signed)
Consultation Note Date: 10/07/2019   Patient Name: Diane Flowers  DOB: 19-Mar-1970  MRN: [id number]  Age / Sex: 49 y.o., female  PCP: Nodal, Alphonzo Dublin, PA-C Referring Physician: Tawni Millers  Reason for Consultation: Establishing goals of care  HPI/Patient Profile: 49 y.o. female  with past medical history of myasthenia gravis, splenic artery aneurysm, thyroid ca, prediabetes. Pancreatic tumor admitted on 10/13/2019 with COVID 19.  Initially, she was seen at outpatient infusion clinic with plan to receive monoclonal antibodies, but she was then referred to hospital with hypoxemia.  She was evaluated by PCCM for potential need for intubation on 9/21 but this was deferred.  She has changed to partial code with no desire for intubation.  Palliative consulted for [place].     Clinical Assessment and Goals of Care: Palliative care consult received.  Chart reviewed including personal review of pertinent labs and imaging.  I discussed with bedside care team and saw and examined Diane Flowers this evening.  She was awake and alert but seemed tired and was not able to fully engage in conversation.  She found it difficult to hear me with PPE and oxygen running at high volume.    I introduced palliative care as specialized medical care for people living with serious illness. It focuses on providing relief from the symptoms and stress of a serious illness. The goal is to improve quality of life for both the patient and the family.  Attempted discussion of things important to her and her understanding of her situation.  She was pleasant but became frustrated with inability to hear me and focus.  We reviewed prior discussion regarding desire for no intubation, but reviewing this got to point that she was finished talking this evening due to fatigue.  She is agreeable to further follow-up in the next day or two.  SUMMARY OF  RECOMMENDATIONS   - Diane Flowers is struggling with the fact that she is critically ill.  Currently, she is a partial code with plans for CPR, but no desire for intubation.  She was too worn out at the time of my encounter to discuss fully today.   - Will follow and progress conversation regarding goals based upon her clinical situation and emotional ability to do so.   Code Status/Advance Care Planning:  Limited code- No intubation  Palliative Prophylaxis:   Delirium Protocol and Frequent Pain Assessment  Additional Recommendations (Limitations, Scope, Preferences):  Full Scope Treatment  Psycho-social/Spiritual:   Desire for further Chaplaincy support:Did not address today  Additional Recommendations: Caregiving  Support/Resources  Prognosis:   Guarded  Discharge Planning: To Be Determined      Primary Diagnoses: Present on Admission: . Acute hypoxemic respiratory failure due to COVID-19 ([place]) . Hypothyroid . Myasthenia gravis ([place])   I have reviewed the medical record, interviewed the patient and family, and examined the patient. The following aspects are pertinent.  Past Medical History:  Diagnosis Date  . Hypothyroidism   . Myasthenia gravis ([place])   . Tumor of thyroid  Social History   Socioeconomic History  . Marital status: Married    Spouse name: Not on file  . Number of children: Not on file  . Years of education: Not on file  . Highest education level: Not on file  Occupational History  . Not on file  Tobacco Use  . Smoking status: Never Smoker  . Smokeless tobacco: Never Used  Vaping Use  . Vaping Use: Never used  Substance and Sexual Activity  . Alcohol use: Never  . Drug use: Never  . Sexual activity: Not on file  Other Topics Concern  . Not on file  Social History Narrative  . Not on file   Social Determinants of Health   Financial Resource Strain:   . Difficulty of Paying Living Expenses: Not on file  Food Insecurity:   . Worried  About Charity fundraiser in the Last Year: Not on file  . Ran Out of Food in the Last Year: Not on file  Transportation Needs:   . Lack of Transportation (Medical): Not on file  . Lack of Transportation (Non-Medical): Not on file  Physical Activity:   . Days of Exercise per Week: Not on file  . Minutes of Exercise per Session: Not on file  Stress:   . Feeling of Stress : Not on file  Social Connections:   . Frequency of Communication with Friends and Family: Not on file  . Frequency of Social Gatherings with Friends and Family: Not on file  . Attends Religious Services: Not on file  . Active Member of Clubs or Organizations: Not on file  . Attends Archivist Meetings: Not on file  . Marital Status: Not on file   History reviewed. No pertinent family history. Scheduled Meds: . aspirin EC  81 mg Oral Daily  . baricitinib  4 mg Oral Daily  . Chlorhexidine Gluconate Cloth  6 each Topical Daily  . chlorpheniramine-HYDROcodone  5 mL Oral Q12H  . enoxaparin (LOVENOX) injection  1 mg/kg Subcutaneous Q12H  . insulin aspart  0-20 Units Subcutaneous TID WC  . insulin detemir  10 Units Subcutaneous BID  . Ipratropium-Albuterol  1 puff Inhalation Q6H  . levothyroxine  175 mcg Oral QAC breakfast  . linagliptin  5 mg Oral Daily  . liothyronine  5 mcg Oral Daily  . mouth rinse  15 mL Mouth Rinse BID  . methylPREDNISolone (SOLU-MEDROL) injection  60 mg Intravenous Q12H  . montelukast  10 mg Oral Daily  . oxybutynin  10 mg Oral Daily  . oxymetazoline  1 spray Each Nare BID  . pantoprazole  40 mg Oral Daily  . pyridostigmine  60 mg Oral QID   Continuous Infusions: . sodium chloride Stopped (09/21/2019 1632)   PRN Meds:.sodium chloride, acetaminophen, benzonatate, guaiFENesin-dextromethorphan, loperamide, morphine injection, ondansetron **OR** ondansetron (ZOFRAN) IV, sodium chloride Medications Prior to Admission:  Prior to Admission medications   Medication Sig Start Date End Date  Taking? Authorizing Provider  acetaminophen (TYLENOL) 500 MG tablet Take 500 mg by mouth every 6 (six) hours as needed.   Yes [provider]  aspirin EC 81 MG tablet Take 81 mg by mouth daily. Swallow whole.   Yes [provider]  celecoxib (CELEBREX) 200 MG capsule Take 200 mg by mouth in the morning and at bedtime.   Yes [provider]  estradiol (VIVELLE-DOT) 0.0375 MG/24HR Place 1 patch onto the skin 2 (two) times a week. Sunday and Wednesday 04/29/18 04/07/20 Yes [provider]  levothyroxine (SYNTHROID) 175 MCG tablet Take 175 mcg by mouth daily before breakfast.   Yes [provider]  liothyronine (CYTOMEL) 5 MCG tablet Take 5 mcg by mouth daily.   Yes [provider]  montelukast (SINGULAIR) 10 MG tablet Take 10 mg by mouth daily. 08/26/19  Yes [provider]  oxybutynin (DITROPAN-XL) 10 MG 24 hr tablet Take 10 mg by mouth daily. 07/29/19  Yes [provider]  pantoprazole (PROTONIX) 40 MG tablet Take 40 mg by mouth daily.  03/30/19  Yes [provider]  pyridostigmine (MESTINON) 60 MG tablet Take 60 mg by mouth 4 (four) times daily. 03/29/19  Yes [provider]  Semaglutide,0.25 or 0.5MG /DOS, (OZEMPIC, 0.25 OR 0.5 MG/DOSE,) 2 MG/1.5ML SOPN Inject 0.5 mg into the skin every Sunday. 08/09/19 11/07/19 Yes [provider]   Allergies  Allergen Reactions  . Flu Virus Vaccine Anaphylaxis  . Sulfa Antibiotics Anaphylaxis  . Sulfamethoxazole Anaphylaxis  . Scopolamine Swelling   Review of Systems  Physical Exam  General: Alert, awake, tired and ill appearing Heart: Regular rate and rhythm. No murmur appreciated. Lungs: Decreased air movement Abdomen: Soft, nontender, nondistended, positive bowel sounds.  Ext: + LE edema Skin: Warm and dry Neuro: Grossly intact, nonfocal.   Vital Signs: BP (!) 146/78   Pulse (!) 101   Temp 99.4 F (37.4 C) (Axillary)   Resp 20   Ht 6' (1.829 m)   Wt  135.2 kg   SpO2 (!) 80%   BMI 40.42 kg/m  Pain Scale: 0-10   Pain Score: 10-Worst pain ever   SpO2: SpO2: (!) 80 % O2 Device:SpO2: (!) 80 % O2 Flow Rate: .O2 Flow Rate (L/min): 60 L/min  IO: Intake/output summary:   Intake/Output Summary (Last 24 hours) at 10/07/2019 0851 Last data filed at 10/07/2019 0500 Gross per 24 hour  Intake --  Output 400 ml  Net -400 ml    LBM: Last BM Date: 10/06/19 Baseline Weight: Weight: 136.1 kg Most recent weight: Weight: 135.2 kg     Palliative Assessment/Data:    Time In: 1800 Time Out: 1845 Time Total: 45 Greater than 50%  of this time was spent counseling and coordinating care related to the above assessment and plan.  Signed by: Micheline Rough, MD   Please contact Palliative Medicine Team phone at [phone] for questions and concerns.  For individual provider: See Shea Evans

## 2019-10-07 NOTE — Progress Notes (Signed)
NAME:  Diane Flowers, MRN:  [id number], DOB:  [birth year], LOS: 7 ADMISSION DATE:  09/29/2019, CONSULTATION DATE: 10/04/19 REFERRING MD: Dr. Sloan Leiter, CHIEF COMPLAINT: Epistaxis   Brief History   [name], unvaccinated female (prior hx of anaphylaxis with flu vaccine), admitted 9/16 with hypoxic respiratory failure in the setting of COVID PNA.  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 at [facility]  Uterine Mass  Hypothyroidism  Splenic artery aneurysm Hiatal hernia  Vitamin D Deficiency  GERD  Significant Hospital Events   9/16 Admit  9/20 PCCM consulted for epistaxis, hypoxic respiratory failure in setting of COVID PNA 9/21 Evaluated by night doc for intubation but deferred  Consults:  Palliative care  Procedures:    Significant Diagnostic Tests:   CT Angio Chest 9/16 >> no central pulmonary embolism, extensive multifocal patchy airspace opacities   LE Vascular Duplex 9/17 >> negative for DVT bilaterally  LE Vascular Duplex 9/21 >> B/L DVT  Echo 9/21 >> EF 60 to 65%, grade 1 DD, normal RV function  Micro Data:  COVID 9/16 >> positive  Blood 9/16 >> negative MRSA screen 9/17 >> negative  Antimicrobials/COVID Rx  Baricitinib 9/17 >> Solu-Medrol 9/17 >> Remdesivir 9/16- 9/20  Interim history/subjective:  C/o neck pain and back pain.  Has dry cough.  Objective   Blood pressure (!) 140/97, pulse 97, temperature 99.4 F (37.4 C), temperature source Axillary, resp. rate 20, height 6' (1.829 m), weight 135.2 kg, SpO2 (!) 80 %.    FiO2 (%):  [100 %] 100 %   Intake/Output Summary (Last 24 hours) at 10/07/2019 0820 Last data filed at  10/07/2019 0500 Gross per 24 hour  Intake --  Output 400 ml  Net -400 ml   Filed Weights   10/01/2019 1335 10/01/19 1500  Weight: 136.1 kg 135.2 kg    Examination:  General - alert Eyes - pupils reactive ENT - no sinus tenderness, no stridor, mild crepitus in neck Cardiac - regular rate/rhythm, no murmur Chest - decreased BS, scattered rhonchi, no wheeze Abdomen - soft, non tender, + bowel sounds Extremities - 1+ edema Skin - no rashes Neuro - follows commands appropriately   Resolved Hospital Problem list   Epistaxis  Assessment & Plan:   Acute hypoxic respiratory failure from COVID 19 pneumonia complicated by pneumomediastinum. Hx of asthma. - completed remdesivir - day 7 of solumedrol and baricitinib - goal SpO2 85 to 95% - f/u CXR - continue singulair - continue antitussive medications  Acute bilateral lower extremity DVTs. - continue lovenox  Steroid induced hyperglycemia. - SSI, tradjenta  Hx of hypothyroidism. - continue synthroid, cytomel  Hx of myasthenia gravis. - continue mestinon  Goals of care. - palliative care consulted  Best practice:  Diet: carb modified, heart healthy DVT prophylaxis: enoxaparin  GI prophylaxis: protonix Mobility: OOB to chair Code Status: No intubation Disposition: ICU  Labs:   CMP Latest Ref Rng & Units 10/07/2019 10/06/2019 10/05/2019  Glucose 70 - 99 mg/dL 170(H) 186(H) 151(H)  BUN 6 - 20 mg/dL 20 18 21(H)  Creatinine 0.44 - 1.00 mg/dL 0.69 0.75 0.67  Sodium 135 - 145 mmol/L 136 134(L) 140  Potassium 3.5 - 5.1 mmol/L 4.2 4.4 4.4  Chloride 98 - 111 mmol/L 96(L) 99 102  CO2 22 - 32 mmol/L _0 Calcium 8.9 - 10.3 mg/dL 7.9(L) 7.5(L) 7.8(L)  Total Protein 6.5 - 8.1 g/dL 6.2(L) 5.9(L) 5.8(L)  Total Bilirubin 0.3 - 1.2 mg/dL 1.2 1.5(H) 1.1  Alkaline Phos 38 - 126 U/L 63 64 64  AST 15 - 41 U/L 42(H) 49(H) 61(H)  ALT 0 - 44 U/L 47(H) 57(H) 70(H)    CBC Latest Ref Rng & Units 10/06/2019 10/05/2019 10/04/2019   WBC 4.0 - 10.5 K/uL 16.2(H) 8.5 6.5  Hemoglobin 12.0 - 15.0 g/dL 13.2 12.8 12.5  Hematocrit 36 - 46 % 40.5 40.2 39.5  Platelets 150 - 400 K/uL 273 232 212    ABG No results found for: PHART, PCO2ART, PO2ART, HCO3, TCO2, ACIDBASEDEF, O2SAT   CBG (last 3)  Recent Labs    10/06/19 1654 10/06/19 2251 10/07/19 0802  GLUCAP 194* 182* 220*    Signature:  Chesley Mires, MD [place] Pager - [phone] 10/07/2019, 8:30 AM

## 2019-10-07 NOTE — Progress Notes (Addendum)
PROGRESS NOTE    Diane Flowers  NKN:[id number] DOB: Jul 23, 1970 DOA: 10/05/2019 PCP: Maggie Schwalbe, PA-C    Brief Narrative:  Patient admitted to the hospital with a working diagnosis of acute hypoxic respiratory failure due to SARS COVID-19 viral pneumonia.  49 year old female with past medical history for myasthenia gravis, splenic artery aneurysm, thyroid cancer/hypothyroidism, prediabetes and history of pancreatic tumor who presented with fevers, chills, loss of taste and cough. She was diagnosed with SARS COVID-19 on 09/24/19. On 9/15 she developed hypoxemia down to 80s. She initially was seen at the outpatient infusion clinic, supposed to get monoclonal antibodies, but due to hypoxemia she was referred to the hospital. On her initial physical examination she was febrile 101 F, tachycardic 101 bpm, tachypneic 25 breaths/min, her blood pressure was 79/62, 136/79, oxygenation 92% on 6 L per nasal cannula. Her lungs had no wheezing, heart S1-S2, present tachycardic, abdomen was soft, no lower extremity edema. Positive right leg tenderness. Her chest radiograph had diffuse, bilateral essential infiltrates, correlated with CT chest.  Patient has been treated with systemic corticosteroids, remdesivir and perceived need. She had worsening hypoxemia, and development of pneumomediastinum.  Very high elevated D-dimer, she has been placed on full anticoagulation. Ultrasonography of the lower extremities with acute deep vein thrombosis involving the right posterior tibial vein, and acute deep vein thrombosis involving the left peroneal veins.  Patient with worsening hypoxemia and increasing oxygen requirements, on 100% Fi02.  She has decided not to be intubated.   Assessment & Plan:   Principal Problem:   Acute hypoxemic respiratory failure due to COVID-19 [facility]) Active Problems:   Hypothyroid   Myasthenia gravis ([place])   DVT of lower extremity, bilateral (HCC)   Pneumonia due to COVID-19  virus   1. Acute hypoxic respiratory failure due to SARS COVID-19 viral pneumonia/ ARDS/ pneumomediastinum. #5 remdesivir.   RR: 20 Pulse oxymetry: 80% Fi02: HFNC at 60 L/min plus NRBM  COVID-19 Labs  Recent Labs    10/05/19 0300 10/06/19 0238 10/07/19 0243  DDIMER >20.00* 11.62* 6.20*  FERRITIN 228  --  345*  CRP 1.8*  --  21.0*    Lab Results  Component Value Date   SARSCOV2NAA POSITIVE (A) 10/07/2019    Patient has decided not to get intubated. She continue to be very weak and deconditioned. Her oxygenation is in the low 80s %. Worsening inflammatory markers with CRP up to 21. Chest film today with diffuse bilateral infiltrates more dense at the right lower and left upper lobe, positive subcutaneus emphysema, pneumomediastinum and small apical right pneumothorax.   Medical therapy with baricitinib #7/14, bronchodilator therapy and antitussive agents. Patient not able to perform airway clearing techniques due to deconditioned and weakness. Not able to stay prone due to right sided chest pain, related to pneumomediastinum.  Increase systemic steroids to TID 60 mg of methylprednisolone.   No significant response to diuresis. Her prognosis is very poor. Mechanical ventilation has the potential of worsening pneumothorax and pneumomediastinum.  Follow up on cell count in am.   Radiographic follow up on pneumothorax.   2. Bilateral acute lower extremity DVT. High pre test probability for PE Continue anticoagulation with enoxaparin.   3. Myasthenia gravis. On pyridostigmine. Difficult to assess exacerbation, due to severe weakness related to COVID 19 acute infection.   4. Prediabetes and obesity class 3. Fasting glucose is 170 this am, patient with poor oral intake due to dyspnea, continue insulin sliding scale for glucose cover and monitoring.   On linagliptine.  5. Hypothyroid. On levothyroxine and Liothyronine.   6. Perineum pressure ulcer (unable to assess  stage). Continue local care  Patient continue to be at high risk for worsening respiratory failure.   Status is: Inpatient  Remains inpatient appropriate because:IV treatments appropriate due to intensity of illness or inability to take PO  Status is: Inpatient  Remains inpatient appropriate because:IV treatments appropriate due to intensity of illness or inability to take PO   Dispo:  Patient From: Home  Planned Disposition: Home with Health Care Svc  Expected discharge date:   Medically stable for discharge: No   DVT prophylaxis: apixaban   Code Status:    partial DNI  Family Communication:   I spoke over the phone with the patient's husband about patient's  condition, plan of care, prognosis and all questions were addressed.    Skin Documentation: Pressure Injury 10/01/19 Perineum Medial MASD (Active)  10/01/19 2100  Location: Perineum  Location Orientation: Medial  Staging:   Wound Description (Comments): MASD  Present on Admission: Yes     Consultants:   Pulmonary    Subjective: Patient continue with significant weakness and fatigue, no nausea or vomiting, continue to have right chest and neck pain.   Objective: Vitals:   10/07/19 0400 10/07/19 0500 10/07/19 0804 10/07/19 0806  BP: (!) 140/97     Pulse: 97     Resp: 20     Temp:  99.4 F (37.4 C)    TempSrc:  Axillary    SpO2: (!) 75%  (!) 79% (!) 80%  Weight:      Height:        Intake/Output Summary (Last 24 hours) at 10/07/2019 1601 Last data filed at 10/07/2019 0500 Gross per 24 hour  Intake --  Output 400 ml  Net -400 ml   Filed Weights   09/15/2019 1335 10/01/19 1500  Weight: 136.1 kg 135.2 kg    Examination:   General: positive resting dyspnea, deconditioned and ill looking appearing  Neurology: Awake and alert, non focal  E ENT: positive pallor, no icterus, oral mucosa moist Cardiovascular: No JVD. S1-S2 present, rhythmic, no gallops, rubs, or murmurs. No lower extremity  edema. Pulmonary: positive breath sounds bilaterally, decreased air movement, no wheezing. Gastrointestinal. Abdomen protuberant  Skin. No rashes Musculoskeletal: no joint deformities     Data Reviewed: I have personally reviewed following labs and imaging studies  CBC: Recent Labs  Lab 10/02/19 0243 10/03/19 0308 10/04/19 0257 10/05/19 0300 10/06/19 0238  WBC 3.2* 6.8 6.5 8.5 16.2*  NEUTROABS 2.3 5.5 5.5 7.4 15.3*  HGB 12.9 13.2 12.5 12.8 13.2  HCT 40.5 41.4 39.5 40.2 40.5  MCV 86.2 86.8 86.6 86.6 86.2  PLT 119* 201 212 232 093   Basic Metabolic Panel: Recent Labs  Lab 10/02/19 0243 10/02/19 0243 10/03/19 0308 10/04/19 0257 10/05/19 0300 10/06/19 0238 10/07/19 0243  NA 137   < > 140 141 140 134* 136  K 4.5   < > 4.3 4.3 4.4 4.4 4.2  CL 107   < > 107 104 102 99 96*  CO2 21*   < > 22 27 29 27 29   GLUCOSE 221*   < > 186* 197* 151* 186* 170*  BUN 17   < > 24* 23* 21* 18 20  CREATININE 0.74   < > 0.89 0.81 0.67 0.75 0.69  CALCIUM 7.7*   < > 8.0* 7.7* 7.8* 7.5* 7.9*  MG 2.2  --  2.5* 2.4 2.8* 2.5*  --    < > =  values in this interval not displayed.   GFR: Estimated Creatinine Clearance: 132.9 mL/min (by C-G formula based on SCr of 0.69 mg/dL). Liver Function Tests: Recent Labs  Lab 10/03/19 0308 10/04/19 0257 10/05/19 0300 10/06/19 0238 10/07/19 0243  AST 111* 82* 61* 49* 42*  ALT 105* 85* 70* 57* 47*  ALKPHOS 66 68 64 64 63  BILITOT 0.6 0.8 1.1 1.5* 1.2  PROT 6.2* 5.9* 5.8* 5.9* 6.2*  ALBUMIN 2.9* 2.9* 2.8* 2.8* 2.8*   No results for input(s): LIPASE, AMYLASE in the last 168 hours. No results for input(s): AMMONIA in the last 168 hours. Coagulation Profile: No results for input(s): INR, PROTIME in the last 168 hours. Cardiac Enzymes: No results for input(s): CKTOTAL, CKMB, CKMBINDEX, TROPONINI in the last 168 hours. BNP (last 3 results) No results for input(s): PROBNP in the last 8760 hours. HbA1C: No results for input(s): HGBA1C in the last 72  hours. CBG: Recent Labs  Lab 10/06/19 0753 10/06/19 1233 10/06/19 1654 10/06/19 2251 10/07/19 0802  GLUCAP 164* 116* 194* 182* 220*   Lipid Profile: No results for input(s): CHOL, HDL, LDLCALC, TRIG, CHOLHDL, LDLDIRECT in the last 72 hours. Thyroid Function Tests: No results for input(s): TSH, T4TOTAL, FREET4, T3FREE, THYROIDAB in the last 72 hours. Anemia Panel: Recent Labs    10/05/19 0300 10/07/19 0243  FERRITIN 228 345*      Radiology Studies: I have reviewed all of the imaging during this hospital visit personally     Scheduled Meds: . aspirin EC  81 mg Oral Daily  . baricitinib  4 mg Oral Daily  . Chlorhexidine Gluconate Cloth  6 each Topical Daily  . chlorpheniramine-HYDROcodone  5 mL Oral Q12H  . enoxaparin (LOVENOX) injection  1 mg/kg Subcutaneous Q12H  . insulin aspart  0-20 Units Subcutaneous TID WC  . insulin detemir  10 Units Subcutaneous BID  . Ipratropium-Albuterol  1 puff Inhalation Q6H  . levothyroxine  175 mcg Oral QAC breakfast  . linagliptin  5 mg Oral Daily  . liothyronine  5 mcg Oral Daily  . mouth rinse  15 mL Mouth Rinse BID  . methylPREDNISolone (SOLU-MEDROL) injection  60 mg Intravenous Q12H  . montelukast  10 mg Oral Daily  . oxybutynin  10 mg Oral Daily  . oxymetazoline  1 spray Each Nare BID  . pantoprazole  40 mg Oral Daily  . pyridostigmine  60 mg Oral QID   Continuous Infusions: . sodium chloride Stopped (09/27/2019 1632)     LOS: 7 days        Keyera Hattabaugh Gerome Apley, MD

## 2019-10-07 NOTE — Progress Notes (Signed)
DG Chest Port 1 View  Result Date: 10/07/2019 CLINICAL DATA:  Reported COVID-19 positive. Hypoxia. Myasthenia gravis EXAM: PORTABLE CHEST 1 VIEW COMPARISON:  October 06, 2019 FINDINGS: There is now extensive pneumomediastinum with trace pneumothorax on the right. There is extensive soft tissue air throughout the chest superiorly. There is multifocal airspace opacity bilaterally, most notable in the left mid lung and both lower lung regions, essentially stable. Heart upper normal in size with pulmonary vascularity normal. No adenopathy. No bone lesions. IMPRESSION: There is now extensive pneumomediastinum and subcutaneous air with trace pneumothorax on the right. Multifocal airspace opacity remains.  Stable cardiac silhouette. These results will be called to the ordering clinician or representative by the Radiologist Assistant, and communication documented in the PACS or [employer]. Electronically Signed   By: Lowella Grip III M.D.   On: 10/07/2019 10:00    CXR reviewed.  Small PTX likely represents air tracking from pneumomediastinum.  Will f/u CXR.  Don't think she needs chest tube placement at this time.  Chesley Mires, MD [place] Pager - [phone] 10/07/2019, 10:41 AM

## 2019-10-08 ENCOUNTER — Inpatient Hospital Stay (HOSPITAL_COMMUNITY): Payer: Commercial Managed Care - PPO

## 2019-10-08 DIAGNOSIS — I824Y3 Acute embolism and thrombosis of unspecified deep veins of proximal lower extremity, bilateral: Secondary | ICD-10-CM

## 2019-10-08 LAB — CBC WITH DIFFERENTIAL/PLATELET
Abs Immature Granulocytes: 0.12 10*3/uL — ABNORMAL HIGH (ref 0.00–0.07)
Basophils Absolute: 0 10*3/uL (ref 0.0–0.1)
Basophils Relative: 0 %
Eosinophils Absolute: 0 10*3/uL (ref 0.0–0.5)
Eosinophils Relative: 0 %
HCT: 36.7 % (ref 36.0–46.0)
Hemoglobin: 11.7 g/dL — ABNORMAL LOW (ref 12.0–15.0)
Immature Granulocytes: 1 %
Lymphocytes Relative: 3 %
Lymphs Abs: 0.3 10*3/uL — ABNORMAL LOW (ref 0.7–4.0)
MCH: 27.5 pg (ref 26.0–34.0)
MCHC: 31.9 g/dL (ref 30.0–36.0)
MCV: 86.4 fL (ref 80.0–100.0)
Monocytes Absolute: 0.3 10*3/uL (ref 0.1–1.0)
Monocytes Relative: 3 %
Neutro Abs: 10.1 10*3/uL — ABNORMAL HIGH (ref 1.7–7.7)
Neutrophils Relative %: 93 %
Platelets: 307 10*3/uL (ref 150–400)
RBC: 4.25 MIL/uL (ref 3.87–5.11)
RDW: 13.1 % (ref 11.5–15.5)
WBC: 10.8 10*3/uL — ABNORMAL HIGH (ref 4.0–10.5)
nRBC: 0 % (ref 0.0–0.2)

## 2019-10-08 LAB — COMPREHENSIVE METABOLIC PANEL
ALT: 57 U/L — ABNORMAL HIGH (ref 0–44)
AST: 44 U/L — ABNORMAL HIGH (ref 15–41)
Albumin: 2.5 g/dL — ABNORMAL LOW (ref 3.5–5.0)
Alkaline Phosphatase: 58 U/L (ref 38–126)
Anion gap: 10 (ref 5–15)
BUN: 17 mg/dL (ref 6–20)
CO2: 28 mmol/L (ref 22–32)
Calcium: 8.3 mg/dL — ABNORMAL LOW (ref 8.9–10.3)
Chloride: 96 mmol/L — ABNORMAL LOW (ref 98–111)
Creatinine, Ser: 0.57 mg/dL (ref 0.44–1.00)
GFR calc Af Amer: >60 mL/min (ref >60)
GFR calc non Af Amer: >60 mL/min (ref >60)
Glucose, Bld: 173 mg/dL — ABNORMAL HIGH (ref 70–99)
Potassium: 4.2 mmol/L (ref 3.5–5.1)
Sodium: 134 mmol/L — ABNORMAL LOW (ref 135–145)
Total Bilirubin: 1.3 mg/dL — ABNORMAL HIGH (ref 0.3–1.2)
Total Protein: 6.1 g/dL — ABNORMAL LOW (ref 6.5–8.1)

## 2019-10-08 LAB — GLUCOSE, CAPILLARY
Glucose-Capillary: 189 mg/dL — ABNORMAL HIGH (ref 70–99)
Glucose-Capillary: 215 mg/dL — ABNORMAL HIGH (ref 70–99)
Glucose-Capillary: 173 mg/dL — ABNORMAL HIGH (ref 70–99)
Glucose-Capillary: 178 mg/dL — ABNORMAL HIGH (ref 70–99)

## 2019-10-08 LAB — D-DIMER, QUANTITATIVE: D-Dimer, Quant: 3.64 ug/mL-FEU — ABNORMAL HIGH (ref 0.00–0.50)

## 2019-10-08 LAB — C-REACTIVE PROTEIN: CRP: 17.4 mg/dL — ABNORMAL HIGH (ref <1.0)

## 2019-10-08 LAB — FERRITIN: Ferritin: 417 ng/mL — ABNORMAL HIGH (ref 11–307)

## 2019-10-08 MED ORDER — BENZONATATE 100 MG PO CAPS
200.0000 mg | ORAL_CAPSULE | Freq: Three times a day (TID) | ORAL | Status: DC | PRN
  Administered 2019-10-08 – 2019-10-09 (×4): 200 mg via ORAL
  Filled 2019-10-08 (×4): qty 2

## 2019-10-08 MED ORDER — OXYMETAZOLINE HCL 0.05 % NA SOLN
1.0000 | Freq: Two times a day (BID) | NASAL | Status: DC | PRN
  Filled 2019-10-08: qty 15

## 2019-10-08 MED ORDER — OXYCODONE HCL ER 10 MG PO T12A
10.0000 mg | EXTENDED_RELEASE_TABLET | Freq: Two times a day (BID) | ORAL | Status: DC
  Administered 2019-10-08 – 2019-10-09 (×4): 10 mg via ORAL
  Filled 2019-10-08 (×4): qty 1

## 2019-10-08 MED ORDER — GUAIFENESIN-DM 100-10 MG/5ML PO SYRP
15.0000 mL | ORAL_SOLUTION | ORAL | Status: DC | PRN
  Administered 2019-10-08 – 2019-10-09 (×5): 15 mL via ORAL
  Filled 2019-10-08 (×5): qty 20

## 2019-10-08 MED ORDER — MORPHINE SULFATE (PF) 4 MG/ML IV SOLN
4.0000 mg | INTRAVENOUS | Status: DC | PRN
  Administered 2019-10-08 – 2019-10-10 (×9): 4 mg via INTRAVENOUS
  Filled 2019-10-08 (×10): qty 1

## 2019-10-08 NOTE — Progress Notes (Addendum)
PROGRESS NOTE    Diane Flowers  QZR:[id number] DOB: 1970-08-12 DOA: 09/19/2019 PCP: Maggie Schwalbe, PA-C    Brief Narrative:  Patient admitted to the hospital with a working diagnosis of acute hypoxic respiratory failure due to SARS COVID-19 viral pneumonia.  49 year old female with past medical history for myasthenia gravis, splenic artery aneurysm, thyroid cancer/hypothyroidism, prediabetes and history of pancreatic tumor who presented with fevers, chills, loss of taste and cough. She was diagnosed with SARS COVID-19 on 09/24/19.On 9/15 she developed hypoxemia down to 80s. She initially was seen at the outpatient infusion clinic, supposed to get monoclonal antibodies,butdue to hypoxemia she was referred to the hospital. On her initial physical examination she was febrile 101 F, tachycardic 101 bpm, tachypneic 25 breaths/min, her blood pressure was 79/62, 136/79, oxygenation 92% on 6 L per nasal cannula. Her lungs had no wheezing, heart S1-S2, present tachycardic, abdomen was soft, no lower extremity edema. Positive right leg tenderness. Her chest radiograph had diffuse, bilateral essential infiltrates, correlated with CT chest.  Patient has been treated with systemic corticosteroids, remdesivir and perceived need. She had worsening hypoxemia, and development of pneumomediastinum.  Very high elevated D-dimer, she has been placed on full anticoagulation. Ultrasonography of the lower extremities with acute deep vein thrombosis involving the right posterior tibial vein,and acute deep vein thrombosis involving the left peroneal veins.  Patient with worsening hypoxemia and increasing oxygen requirements, on 100% Fi02.  She has decided not to be intubated.   Follow up chest film with diffuse bilateral infiltrates more dense at the right lower and left upper lobe. She has developed pneumomediastinum and small apical right pneumothorax. Persistent cough and neck pain.   Assessment &  Plan:   Principal Problem:   Acute hypoxemic respiratory failure due to COVID-19 [facility]) Active Problems:   Hypothyroid   Myasthenia gravis ([place])   DVT of lower extremity, bilateral ([facility])   Pneumonia due to COVID-19 virus  Sepsis ruled out, patient's condition due to severe viral pneumonia.   1.Acute hypoxic respiratory failure due to SARS COVID-19 viral pneumonia/ ARDS/ pneumomediastinum.#5 remdesivir.  RR: 22 Pulse oxymetry: 86%  Fi02: 100% with 60 L/min heated HFNC plus NRBM   COVID-19 Labs  Recent Labs    10/06/19 0238 10/07/19 0243 10/08/19 0321  DDIMER 11.62* 6.20* 3.64*  FERRITIN  --  345* 417*  CRP  --  21.0* 17.4*    Lab Results  Component Value Date   SARSCOV2NAA POSITIVE (A) 10/05/2019    Patient continue with 60 L/min at 100% fi02 with oxygenation in the mid 80's. Her dyspnea is stable but she has significant pain and persistent cough at the right neck. The more she coughs the more pain is experiencing. Likely severe cough is making subcutaneous emphysema worse. Her inflammatory markers continue to be elevated but trending down d dimer and CRP. Patient awake and alert per her report her level of dyspnea is stable.   Add long acting opiate with oxycodone 10 mg bid, increase as needed guaifenesin dextromethorphan from 10 to 15 ml and continue with chlorpheniramine-hydrocodone bid. Increase dose of morphine from 2 to 4 mg for break through pain.  Continue bronchodilator therapy and airway clearing techniques.   Continue medical therapy with baricitinib and high dose systemic steroids. Keep oxygenation more than 85%.  Follow up inflammatory markers in am. Today's chest film with no further evidence of right apical pneumothorax.  Follow up chest film in am.    2. Bilateral acute lower extremity DVT. High pre test  probability for PE . Continue full dose anticoagulation with enoxaparin, will plan to transition to [place] when patient more stable.    3. Myasthenia  gravis. Continue pyridostigmine. No clinical signs of exacerbation.   4. Prediabetes and obesity class 3. Fasting glucose is 173 this am, tolerating po well, no nausea or vomiting. Continue insulin sliding scale for glucose cover and monitoring.  Continue with linagliptine and basal insulin with 10 units levemir.    5. Hypothyroid. continue with levothyroxine and Liothyronine.   6. Perineum pressure ulcer (unable to assess stage). Local skin care  Patient continue to be at high risk forworsening respiratory failure.  Status is: Inpatient   Status is: Inpatient  Remains inpatient appropriate because:IV treatments appropriate due to intensity of illness or inability to take PO   Dispo:  Patient From: Home  Planned Disposition: Home with Health Care Svc  Expected discharge date:   Medically stable for discharge: No    DVT prophylaxis: Enoxaparin  Code Status:   Full but not intubate   Family Communication:   I spoke over the phone with the patient's daughter [name] [phone]) about patient's  condition, plan of care, prognosis and all questions were addressed.      Skin Documentation: Pressure Injury 10/01/19 Perineum Medial MASD (Active)  10/01/19 2100  Location: Perineum  Location Orientation: Medial  Staging:   Wound Description (Comments): MASD  Present on Admission: Yes     Consultants:   Pulmonary   Subjective: Patient with persistent pain at the right neck, that is worse with cough which is dry, no postnasal dripping. The more cough the more pain. Dyspnea is stable, no nausea or vomiting.   Objective: Vitals:   10/08/19 0500 10/08/19 0517 10/08/19 0600 10/08/19 0800  BP:   131/71   Pulse:  75 75 79  Resp:  20 18 (!) 22  Temp: 98.5 F (36.9 C)   98.7 F (37.1 C)  TempSrc: Axillary   Axillary  SpO2:  (!) 89% (!) 82% (!) 86%  Weight:      Height:        Intake/Output Summary (Last 24 hours) at 10/08/2019 2703 Last data filed at  10/08/2019 0026 Gross per 24 hour  Intake --  Output 1500 ml  Net -1500 ml   Filed Weights   09/27/2019 1335 10/01/19 1500  Weight: 136.1 kg 135.2 kg    Examination:   General: deconditioned and in pain, ill looking appearing  Neurology: Awake and alert, non focal  E ENT: no pallor, no icterus, oral mucosa moist/ pain at the right side of the neck, no masses.  Cardiovascular: No JVD. S1-S2 present, rhythmic, no gallops, rubs, or murmurs. No lower extremity edema. Pulmonary: positive breath sounds bilaterally with no wheezing. Gastrointestinal. Abdomen soft and non tender Skin. No rashes Musculoskeletal: no joint deformities     Data Reviewed: I have personally reviewed following labs and imaging studies  CBC: Recent Labs  Lab 10/03/19 0308 10/04/19 0257 10/05/19 0300 10/06/19 0238 10/08/19 0321  WBC 6.8 6.5 8.5 16.2* 10.8*  NEUTROABS 5.5 5.5 7.4 15.3* 10.1*  HGB 13.2 12.5 12.8 13.2 11.7*  HCT 41.4 39.5 40.2 40.5 36.7  MCV 86.8 86.6 86.6 86.2 86.4  PLT 201 212 232 273 500   Basic Metabolic Panel: Recent Labs  Lab 10/02/19 0243 10/02/19 0243 10/03/19 0308 10/03/19 0308 10/04/19 0257 10/05/19 0300 10/06/19 0238 10/07/19 0243 10/08/19 0321  NA 137   < > 140   < > 141 140  134* 136 134*  K 4.5   < > 4.3   < > 4.3 4.4 4.4 4.2 4.2  CL 107   < > 107   < > 104 102 99 96* 96*  CO2 21*   < > 22   < > 27 29 27 29 28   GLUCOSE 221*   < > 186*   < > 197* 151* 186* 170* 173*  BUN 17   < > 24*   < > 23* 21* 18 20 17   CREATININE 0.74   < > 0.89   < > 0.81 0.67 0.75 0.69 0.57  CALCIUM 7.7*   < > 8.0*   < > 7.7* 7.8* 7.5* 7.9* 8.3*  MG 2.2  --  2.5*  --  2.4 2.8* 2.5*  --   --    < > = values in this interval not displayed.   GFR: Estimated Creatinine Clearance: 132.9 mL/min (by C-G formula based on SCr of 0.57 mg/dL). Liver Function Tests: Recent Labs  Lab 10/04/19 0257 10/05/19 0300 10/06/19 0238 10/07/19 0243 10/08/19 0321  AST 82* 61* 49* 42* 44*  ALT 85* 70*  57* 47* 57*  ALKPHOS 68 64 64 63 58  BILITOT 0.8 1.1 1.5* 1.2 1.3*  PROT 5.9* 5.8* 5.9* 6.2* 6.1*  ALBUMIN 2.9* 2.8* 2.8* 2.8* 2.5*   No results for input(s): LIPASE, AMYLASE in the last 168 hours. No results for input(s): AMMONIA in the last 168 hours. Coagulation Profile: No results for input(s): INR, PROTIME in the last 168 hours. Cardiac Enzymes: No results for input(s): CKTOTAL, CKMB, CKMBINDEX, TROPONINI in the last 168 hours. BNP (last 3 results) No results for input(s): PROBNP in the last 8760 hours. HbA1C: No results for input(s): HGBA1C in the last 72 hours. CBG: Recent Labs  Lab 10/07/19 0802 10/07/19 1202 10/07/19 1630 10/07/19 2055 10/08/19 0800  GLUCAP 220* 201* 173* 212* 189*   Lipid Profile: No results for input(s): CHOL, HDL, LDLCALC, TRIG, CHOLHDL, LDLDIRECT in the last 72 hours. Thyroid Function Tests: No results for input(s): TSH, T4TOTAL, FREET4, T3FREE, THYROIDAB in the last 72 hours. Anemia Panel: Recent Labs    10/07/19 0243 10/08/19 0321  FERRITIN 345* 417*      Radiology Studies: I have reviewed all of the imaging during this hospital visit personally     Scheduled Meds: . aspirin EC  81 mg Oral Daily  . baricitinib  4 mg Oral Daily  . Chlorhexidine Gluconate Cloth  6 each Topical Daily  . chlorpheniramine-HYDROcodone  5 mL Oral Q12H  . enoxaparin (LOVENOX) injection  1 mg/kg Subcutaneous Q12H  . insulin aspart  0-20 Units Subcutaneous TID WC  . insulin detemir  10 Units Subcutaneous BID  . Ipratropium-Albuterol  1 puff Inhalation Q6H  . levothyroxine  175 mcg Oral QAC breakfast  . linagliptin  5 mg Oral Daily  . liothyronine  5 mcg Oral Daily  . mouth rinse  15 mL Mouth Rinse BID  . methylPREDNISolone (SOLU-MEDROL) injection  60 mg Intravenous Q8H  . montelukast  10 mg Oral Daily  . oxybutynin  10 mg Oral Daily  . oxymetazoline  1 spray Each Nare BID  . pantoprazole  40 mg Oral Daily  . pyridostigmine  60 mg Oral QID    Continuous Infusions: . sodium chloride Stopped (10/02/2019 1632)     LOS: 8 days        Jaianna Nicoll Gerome Apley, MD

## 2019-10-08 NOTE — Progress Notes (Signed)
NAME:  Diane Flowers, MRN:  [id number], DOB:  [birth year], LOS: 8 ADMISSION DATE:  10/10/2019, CONSULTATION DATE: 10/04/19 REFERRING MD: Dr. Sloan Leiter, CHIEF COMPLAINT: Epistaxis   Brief History   [name], unvaccinated female (prior hx of anaphylaxis with flu vaccine), admitted 9/16 with hypoxic respiratory failure in the setting of COVID PNA.  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  Uterine Mass  Hypothyroidism  Splenic artery aneurysm Hiatal hernia  Vitamin D Deficiency  GERD  Significant Hospital Events   9/16 Admit  9/20 PCCM consulted for epistaxis, hypoxic respiratory failure in setting of COVID PNA 9/21 Evaluated by night doc for intubation but deferred  Consults:  Palliative care  Procedures:    Significant Diagnostic Tests:   CT Angio Chest 9/16 >> no central pulmonary embolism, extensive multifocal patchy airspace opacities   LE Vascular Duplex 9/17 >> negative for DVT bilaterally  LE Vascular Duplex 9/21 >> B/L DVT  Echo 9/21 >> EF 60 to 65%, grade 1 DD, normal RV function  Micro Data:  COVID 9/16 >> positive  Blood 9/16 >> negative MRSA screen 9/17 >> negative  Antimicrobials/COVID Rx  Baricitinib 9/17 >> Solu-Medrol 9/17 >> Remdesivir 9/16- 9/20  Interim history/subjective:  Continues to have cough.  Neck/back pain still present, but improved compared to 9/23.  Objective   Blood pressure 131/71, pulse 79, temperature 98.7 F (37.1 C), temperature source Axillary, resp. rate (!) 22, height 6' (1.829 m), weight 135.2 kg, SpO2 (!) 86 %.    FiO2 (%):  [100 %] 100 %   Intake/Output Summary (Last 24 hours)  at 10/08/2019 0920 Last data filed at 10/08/2019 0026 Gross per 24 hour  Intake --  Output 1500 ml  Net -1500 ml   Filed Weights   10/06/2019 1335 10/01/19 1500  Weight: 136.1 kg 135.2 kg    Examination:  General - alert Eyes - pupils reactive ENT - no sinus tenderness, no stridor, mild crepitus in neck Cardiac - regular rate/rhythm, no murmur Chest - scattered rhonchi Abdomen - soft, non tender, + bowel sounds Extremities - 1+ edema Skin - no rashes Neuro - follows commands   Resolved Hospital Problem list   Epistaxis  Assessment & Plan:   Acute hypoxic respiratory failure from COVID 19 pneumonia complicated by pneumomediastinum. Hx of asthma. - completed remdesivir - day 8 of solumedrol and baricitinib - goal SpO2 85 to 95%; continue heated high flow oxygen - f/u CXR intermittently - continue singulair - continue tussionex, tessalon perles - continue BDs  Acute bilateral lower extremity DVTs. - continue lovenox  Steroid induced hyperglycemia. - SSI, tradjenta  Hx of hypothyroidism. - continue synthroid, cytomel  Hx of myasthenia gravis. - continue mestinon  Goals of care. - palliative care consulted  PCCM will follow up on Monday, 10/11/19 >> call if help needed sooner  Best practice:  Diet: carb modified, heart healthy DVT prophylaxis: enoxaparin  GI prophylaxis: protonix Mobility: OOB to chair Code Status: No intubation Disposition: ICU  Labs:   CMP Latest Ref Rng & Units 10/08/2019 10/07/2019 10/06/2019  Glucose 70 - 99 mg/dL 173(H) 170(H) 186(H)  BUN 6 - 20 mg/dL 17 20 18   Creatinine 0.44 - 1.00  mg/dL 0.57 0.69 0.75  Sodium 135 - 145 mmol/L 134(L) 136 134(L)  Potassium 3.5 - 5.1 mmol/L 4.2 4.2 4.4  Chloride 98 - 111 mmol/L 96(L) 96(L) 99  CO2 22 - 32 mmol/L 28 29 27   Calcium 8.9 - 10.3 mg/dL 8.3(L) 7.9(L) 7.5(L)  Total Protein 6.5 - 8.1 g/dL 6.1(L) 6.2(L) 5.9(L)  Total Bilirubin 0.3 - 1.2 mg/dL 1.3(H) 1.2 1.5(H)  Alkaline Phos 38 - 126 U/L  58 63 64  AST 15 - 41 U/L 44(H) 42(H) 49(H)  ALT 0 - 44 U/L 57(H) 47(H) 57(H)    CBC Latest Ref Rng & Units 10/08/2019 10/06/2019 10/05/2019  WBC 4.0 - 10.5 K/uL 10.8(H) 16.2(H) 8.5  Hemoglobin 12.0 - 15.0 g/dL 11.7(L) 13.2 12.8  Hematocrit 36 - 46 % 36.7 40.5 40.2  Platelets 150 - 400 K/uL 307 273 232    ABG No results found for: PHART, PCO2ART, PO2ART, HCO3, TCO2, ACIDBASEDEF, O2SAT   CBG (last 3)  Recent Labs    10/07/19 1630 10/07/19 2055 10/08/19 0800  GLUCAP 173* 212* 189*    Signature:  Chesley Mires, MD [place] Pager - [phone] 10/08/2019, 9:20 AM

## 2019-10-09 LAB — FERRITIN: Ferritin: 372 ng/mL — ABNORMAL HIGH (ref 11–307)

## 2019-10-09 LAB — GLUCOSE, CAPILLARY
Glucose-Capillary: 148 mg/dL — ABNORMAL HIGH (ref 70–99)
Glucose-Capillary: 200 mg/dL — ABNORMAL HIGH (ref 70–99)
Glucose-Capillary: 102 mg/dL — ABNORMAL HIGH (ref 70–99)
Glucose-Capillary: 155 mg/dL — ABNORMAL HIGH (ref 70–99)

## 2019-10-09 LAB — COMPREHENSIVE METABOLIC PANEL
ALT: 50 U/L — ABNORMAL HIGH (ref 0–44)
AST: 34 U/L (ref 15–41)
Albumin: 2.7 g/dL — ABNORMAL LOW (ref 3.5–5.0)
Alkaline Phosphatase: 59 U/L (ref 38–126)
Anion gap: 11 (ref 5–15)
BUN: 18 mg/dL (ref 6–20)
CO2: 26 mmol/L (ref 22–32)
Calcium: 8.3 mg/dL — ABNORMAL LOW (ref 8.9–10.3)
Chloride: 95 mmol/L — ABNORMAL LOW (ref 98–111)
Creatinine, Ser: 0.71 mg/dL (ref 0.44–1.00)
GFR calc Af Amer: >60 mL/min (ref >60)
GFR calc non Af Amer: >60 mL/min (ref >60)
Glucose, Bld: 173 mg/dL — ABNORMAL HIGH (ref 70–99)
Potassium: 4 mmol/L (ref 3.5–5.1)
Sodium: 132 mmol/L — ABNORMAL LOW (ref 135–145)
Total Bilirubin: 1.5 mg/dL — ABNORMAL HIGH (ref 0.3–1.2)
Total Protein: 6.2 g/dL — ABNORMAL LOW (ref 6.5–8.1)

## 2019-10-09 LAB — D-DIMER, QUANTITATIVE: D-Dimer, Quant: 3.11 ug/mL-FEU — ABNORMAL HIGH (ref 0.00–0.50)

## 2019-10-09 LAB — C-REACTIVE PROTEIN: CRP: 11.5 mg/dL — ABNORMAL HIGH (ref <1.0)

## 2019-10-09 MED ORDER — POLYVINYL ALCOHOL 1.4 % OP SOLN: 1.0000 [drp] | OPHTHALMIC | Status: DC | PRN

## 2019-10-09 MED ORDER — RIVAROXABAN 20 MG PO TABS: 20.0000 mg | ORAL_TABLET | Freq: Every day | ORAL | Status: DC

## 2019-10-09 MED ORDER — RIVAROXABAN 15 MG PO TABS
15.0000 mg | ORAL_TABLET | Freq: Two times a day (BID) | ORAL | Status: DC
  Administered 2019-10-09: 15 mg via ORAL
  Filled 2019-10-09: qty 1

## 2019-10-09 MED ORDER — PHENOL 1.4 % MT LIQD: 1.0000 | OROMUCOSAL | Status: DC | PRN

## 2019-10-09 NOTE — Discharge Instructions (Signed)
Information on my medicine - XARELTO (rivaroxaban)  This medication education was reviewed with me or my healthcare representative as part of my discharge preparation.  The pharmacist that spoke with me during my hospital stay was:    WHY WAS XARELTO PRESCRIBED FOR YOU? Xarelto was prescribed to treat blood clots that may have been found in the veins of your legs (deep vein thrombosis) or in your lungs (pulmonary embolism) and to reduce the risk of them occurring again.  What do you need to know about Xarelto? The starting dose is one 15 mg tablet taken TWICE daily with food for the FIRST 21 DAYS then on 10/16  the dose is changed to one 20 mg tablet taken ONCE A DAY with your evening meal.  DO NOT stop taking Xarelto without talking to the health care provider who prescribed the medication.  Refill your prescription for 20 mg tablets before you run out.  After discharge, you should have regular check-up appointments with your healthcare provider that is prescribing your Xarelto.  In the future your dose may need to be changed if your kidney function changes by a significant amount.  What do you do if you miss a dose? If you are taking Xarelto TWICE DAILY and you miss a dose, take it as soon as you remember. You may take two 15 mg tablets (total 30 mg) at the same time then resume your regularly scheduled 15 mg twice daily the next day.  If you are taking Xarelto ONCE DAILY and you miss a dose, take it as soon as you remember on the same day then continue your regularly scheduled once daily regimen the next day. Do not take two doses of Xarelto at the same time.   Important Safety Information Xarelto is a blood thinner medicine that can cause bleeding. You should call your healthcare provider right away if you experience any of the following: ? Bleeding from an injury or your nose that does not stop. ? Unusual colored urine (red or dark brown) or unusual colored stools (red or  black). ? Unusual bruising for unknown reasons. ? A serious fall or if you hit your head (even if there is no bleeding).  Some medicines may interact with Xarelto and might increase your risk of bleeding while on Xarelto. To help avoid this, consult your healthcare provider or pharmacist prior to using any new prescription or non-prescription medications, including herbals, vitamins, non-steroidal anti-inflammatory drugs (NSAIDs) and supplements.  This website has more information on Xarelto: [email or web address]

## 2019-10-09 NOTE — Progress Notes (Signed)
Daily Progress Note   Patient Name: Diane Flowers       Date: 10/09/2019 DOB: Sep 05, 1970  Age: 49 y.o. MRN#: [id number] Attending Physician: Tawni Millers Primary Care Physician: Diane Schwalbe, PA-C Admit Date: 09/21/2019  Reason for Consultation/Follow-up: Establishing goals of care  Subjective: I met with Diane Flowers this evening.  She was lying in the bed with increased work of breathing.  She reports that she "maybe" feels better with increase in opioids earlier today.  She reports being worried and feels as though her pain and shortness of breath get worse when she gets anxious or when she coughs.  We discussed regarding pneumomediastinum as well as other concerns related to her severe viral pneumonia.  We talked more about her hope for improvement and her concern about her condition overall.  She endorsed not wanting to be intubated, but wanting heroic interventions in the event of cardiac arrest.  Length of Stay: 9  Current Medications: Scheduled Meds:  . aspirin EC  81 mg Oral Daily  . baricitinib  4 mg Oral Daily  . Chlorhexidine Gluconate Cloth  6 each Topical Daily  . chlorpheniramine-HYDROcodone  5 mL Oral Q12H  . enoxaparin (LOVENOX) injection  1 mg/kg Subcutaneous Q12H  . insulin aspart  0-20 Units Subcutaneous TID WC  . insulin detemir  10 Units Subcutaneous BID  . Ipratropium-Albuterol  1 puff Inhalation Q6H  . levothyroxine  175 mcg Oral QAC breakfast  . linagliptin  5 mg Oral Daily  . liothyronine  5 mcg Oral Daily  . mouth rinse  15 mL Mouth Rinse BID  . methylPREDNISolone (SOLU-MEDROL) injection  60 mg Intravenous Q8H  . montelukast  10 mg Oral Daily  . oxybutynin  10 mg Oral Daily  . oxyCODONE  10 mg Oral Q12H  . pantoprazole  40 mg Oral Daily   . pyridostigmine  60 mg Oral QID    Continuous Infusions: . sodium chloride Stopped (10/13/2019 1632)    PRN Meds: sodium chloride, acetaminophen, benzonatate, guaiFENesin-dextromethorphan, loperamide, morphine injection, ondansetron **OR** ondansetron (ZOFRAN) IV, oxymetazoline, sodium chloride  Physical Exam         General: Chronically ill-appearing, weak and deconditioned Heart: Regular rate and rhythm. No murmur appreciated. Lungs: Fair air movement, no wheeze appreciated Abdomen: Soft, nontender, nondistended, positive bowel sounds.  Ext: No significant edema Skin: Warm and dry Neuro: Grossly intact, nonfocal.   Vital Signs: BP (!) 170/73   Pulse 70   Temp 98.1 F (36.7 C) (Axillary)   Resp 14   Ht 6' (1.829 m)   Wt 135.2 kg   SpO2 (!) 81%   BMI 40.42 kg/m  SpO2: SpO2: (!) 81 % O2 Device: O2 Device: High Flow Nasal Cannula, NRB O2 Flow Rate: O2 Flow Rate (L/min): 70 L/min  Intake/output summary:   Intake/Output Summary (Last 24 hours) at 10/09/2019 0933 Last data filed at 10/08/2019 1800 Gross per 24 hour  Intake 1200 ml  Output 1150 ml  Net 50 ml   LBM: Last BM Date: 10/08/19 Baseline Weight: Weight: 136.1 kg Most recent weight: Weight: 135.2 kg       Palliative Assessment/Data:      Patient Active Problem List   Diagnosis Date Noted  . DVT of lower extremity, bilateral ([place]) 10/06/2019  . Pneumonia due to COVID-19 virus 10/06/2019  . Acute hypoxemic respiratory failure due to COVID-19 ([place]) 09/21/2019  . Sepsis ([place]) 09/22/2019  . Hypothyroid 09/27/2019  . Myasthenia gravis ([place]) 09/21/2019  . Positive D dimer 09/17/2019    Palliative Care Assessment & Plan   Patient Profile: 49 y.o. female  with past medical history of myasthenia gravis, splenic artery aneurysm, thyroid ca, prediabetes. Pancreatic tumor admitted on 09/16/2019 with COVID 19.  Initially, she was seen at outpatient infusion clinic with plan to receive monoclonal antibodies, but  she was then referred to hospital with hypoxemia.  She was evaluated by PCCM for potential need for intubation on 9/21 but this was deferred.  She has changed to partial code with no desire for intubation.  Palliative consulted for [place].  Recommendations/Plan: -Diane Flowers continues to struggle with the fact that she is critically ill and may not recover from this.  She has endorsed desire for no intubation but would still like heroic interventions in event of cardiac arrest.  She becomes anxious when discussing which drives her to have more symptoms with shortness of breath and pain.  At this point, I do not think we are benefiting her from continuing to try to readdress these issues as it causes her to be more anxious and feel worse without really having progress in conversation about any changes to her care plan.   -We will continue to follow and support intermittently, but I do not think that things are going to change dramatically regarding her goals if she remains alert and interactive.  She appears tired and uncomfortable, but when I talk with her she is struggling with decision to do anything other than continue current care.  She declined for me to call anyone today to be part of our discussion.  If there are changes to her mental status, we will need to continue conversation with her family and I would be happy to engage to support family at that time. - Otherwise, will plan to check in sometime this weekend or Monday.  Please call if there are specific needs with which we can be of assistance.    Code Status:    Code Status Orders  (From admission, onward)         Start     Ordered   10/06/19 1342  Limited resuscitation (code)  Continuous       Question Answer Comment  In the event of cardiac or respiratory ARREST: Initiate Code Blue, Call Rapid Response Yes   In the event  of cardiac or respiratory ARREST: Perform CPR Yes   In the event of cardiac or respiratory ARREST: Perform  Intubation/Mechanical Ventilation No   In the event of cardiac or respiratory ARREST: Use NIPPV/BiPAp only if indicated Yes   In the event of cardiac or respiratory ARREST: Administer ACLS medications if indicated Yes   In the event of cardiac or respiratory ARREST: Perform Defibrillation or Cardioversion if indicated Yes      10/06/19 1342        Code Status History    Date Active Date Inactive Code Status Order ID Comments User Context   10/03/2019 1419 10/06/2019 1342 Full Code [id number]  Barb Merino, MD Inpatient   10/03/2019 1513 10/03/2019 1419 Partial Code [id number]  Harold Hedge, MD ED   Advance Care Planning Activity       Prognosis:   Guarded  Discharge Planning:  To Be Determined  Care plan was discussed with patient  Thank you for allowing the Palliative Medicine Team to assist in the care of this patient.   Total Time 30 Prolonged Time Billed No      Greater than 50%  of this time was spent counseling and coordinating care related to the above assessment and plan.  Micheline Rough, MD  Please contact Palliative Medicine Team phone at [phone] for questions and concerns.

## 2019-10-09 NOTE — Progress Notes (Signed)
ANTICOAGULATION CONSULT NOTE - Initial Consult  Pharmacy Consult for Xarelto Indication: DVT  Allergies  Allergen Reactions  . Flu Virus Vaccine Anaphylaxis  . Sulfa Antibiotics Anaphylaxis  . Sulfamethoxazole Anaphylaxis  . Scopolamine Swelling    Patient Measurements: Height: 6' (182.9 cm) Weight: 135.2 kg (298 lb 1 oz) IBW/kg (Calculated) : 73.1  Vital Signs: Temp: 98.8 F (37.1 C) (09/25 1200) Temp Source: Axillary (09/25 1200) BP: 163/80 (09/25 1036) Pulse Rate: 101 (09/25 1100)  Labs: Recent Labs    10/07/19 0243 10/08/19 0321 10/09/19 0242  HGB  --  11.7*  --   HCT  --  36.7  --   PLT  --  307  --   CREATININE 0.69 0.57 0.71    Estimated Creatinine Clearance: 132.9 mL/min (by C-G formula based on SCr of 0.71 mg/dL).   Medical History: Past Medical History:  Diagnosis Date  . Hypothyroidism   . Myasthenia gravis ([place])   . Tumor of thyroid     Assessment: [name] admitted with COVID PNA developed DVT, possible PE. Pharmacy consulted to assist in transitioning from full-dose Lovenox to Xarelto. Last dose of Lovenox administered this AM.  Goal of Therapy:  Monitor platelets by anticoagulation protocol: Yes   Plan:  Transition to Xarelto 15 mg bid x 21 days followed by 20 mg daily thereafter.   Will continue to monitor remotely and sing off note writing   Thank you for the consult   Napoleon Form 10/09/2019,1:00 PM

## 2019-10-09 NOTE — Progress Notes (Addendum)
PROGRESS NOTE    Diane Flowers  TOI:[id number] DOB: [birth year] DOA: 09/20/2019 PCP: Maggie Schwalbe, PA-C    Brief Narrative:  Patient admitted to the hospital with a working diagnosis of acute hypoxic respiratory failure due to SARS COVID-19 viral pneumonia.  49 year old female with past medical history for myasthenia gravis, splenic artery aneurysm, thyroid cancer/hypothyroidism, prediabetes and history of pancreatic tumor who presented with fevers, chills, loss of taste and cough. She was diagnosed with SARS COVID-19 on 09/24/19.On 9/15 she developed hypoxemia down to 80s. She initially was seen at the outpatient infusion clinic, supposed to get monoclonal antibodies,butdue to hypoxemia she was referred to the hospital. On her initial physical examination she was febrile 101 F, tachycardic 101 bpm, tachypneic 25 breaths/min, her blood pressure was 79/62, 136/79, oxygenation 92% on 6 L per nasal cannula. Her lungs had no wheezing, heart S1-S2, present tachycardic, abdomen was soft, no lower extremity edema. Positive right leg tenderness. Her chest radiograph had diffuse, bilateral essential infiltrates, correlated with CT chest.  Patient has been treated with systemic corticosteroids, remdesivir and perceived need. She had worsening hypoxemia, and development of pneumomediastinum.  Very high elevated D-dimer, she has been placed on full anticoagulation. Ultrasonography of the lower extremities with acute deep vein thrombosis involving the right posterior tibial vein,and acute deep vein thrombosis involving the left peroneal veins.  Patient with worsening hypoxemia and increasing oxygen requirements, on 100% Fi02.  She has decided not to be intubated.  Follow up chest film with diffuse bilateral infiltrates more dense at the right lower and left upper lobe. She has developed pneumomediastinum and small apical right pneumothorax.Persistent cough and neck pain.  Patient has been  placed on opiates analgesics for neck pain.    Assessment & Plan:   Principal Problem:   Acute hypoxemic respiratory failure due to COVID-19 [facility]) Active Problems:   Hypothyroid   Myasthenia gravis ([place])   DVT of lower extremity, bilateral (HCC)   Pneumonia due to COVID-19 virus  Sepsis ruled out, patient's condition due to severe viral pneumonia.   1.Acute hypoxic respiratory failure due to SARS COVID-19 viral pneumonia/ ARDS/ pneumomediastinum.#5 remdesivir  RR: 25 Pulse oxymetry: 81 to 83%  Fi02: 70 - 60L/min 100% Fi02 Heated HFNC plus NRBM   COVID-19 Labs  Recent Labs    10/07/19 0243 10/08/19 0321 10/09/19 0242  DDIMER 6.20* 3.64* 3.11*  FERRITIN 345* 417* 372*  CRP 21.0* 17.4* 11.5*    Lab Results  Component Value Date   SARSCOV2NAA POSITIVE (A) 09/29/2019    Inflammatory markers are trending down, neck pain and cough have improved. Stable dyspnea. Clinically improved neck edema on the right side.   Medical therapy with baricitinib and high dose systemic steroids, 60 mg IV methylprednisolone q 8 H. Continue with antitussive agents, airway clearing techniques and high dose antitussive agents.  Patient is tolerating well oxycodone 10 mg bid and as needed morphine for pain control.   Keep oxygenation more than 80%, continue to follow up on inflammatory markers. Follow chest radiograph in 48 H.   2. Bilateral acute lower extremity DVT.High pre test probability for PE. Patient tolerating well anticoagulation, will transition to oral coagulants with rivaroxaban.  Echocardiogram with preserved LV and RV function.   3. Myasthenia gravis.On pyridostigmine.No signs of exacerbation.  4. Prediabetes and obesity class 3. Continue glucose control with insulin sliding scale for glucose cover and monitoring.  On linagliptine and basal insulin with 10 units levemir.    5. Hypothyroid.On levothyroxine andLiothyronine.  6. Perineum pressure ulcer(unable to  assess stage).Local skin care  Patient critically will due to hypoxemic respiratory failure with imminent risk of worsening will continue medical therapy to prevent deterioration.  Critical care time 35 minutes,   Status is: Inpatient  Remains inpatient appropriate because:IV treatments appropriate due to intensity of illness or inability to take PO   Dispo:  Patient From: Home  Planned Disposition: Home with Health Care Svc  Expected discharge date:   Medically stable for discharge: No   DVT prophylaxis: rivaroxaban   Code Status:    full  Family Communication:   I spoke over the phone with the patient's daughter about patient's  condition, plan of care, prognosis and all questions were addressed.    Nutrition Status:           Skin Documentation: Pressure Injury 10/01/19 Perineum Medial MASD (Active)  10/01/19 2100  Location: Perineum  Location Orientation: Medial  Staging:   Wound Description (Comments): MASD  Present on Admission: Yes     Consultants:   Pulmonary    Subjective: Patient is feeling better than yesterday, her neck pain has improved but not yet back to baseline, cough also improved, no nausea or vomiting, continue with dyspnea.   Objective: Vitals:   10/09/19 0008 10/09/19 0338 10/09/19 0400 10/09/19 0424  BP: (!) 154/113 (!) 154/113 (!) 170/73   Pulse: 91  73 70  Resp: 17  (!) 25 14  Temp: 99.9 F (37.7 C)  98.1 F (36.7 C)   TempSrc: Axillary  Axillary   SpO2: (!) 86% (!) 70% (!) 83% (!) 81%  Weight:      Height:        Intake/Output Summary (Last 24 hours) at 10/09/2019 0818 Last data filed at 10/08/2019 1800 Gross per 24 hour  Intake 1200 ml  Output 1150 ml  Net 50 ml   Filed Weights   10/14/2019 1335 10/01/19 1500  Weight: 136.1 kg 135.2 kg    Examination:   General: positive dyspnea at reset. Deconditioned  Neurology: Awake and alert, non focal  E ENT: no pallor, no icterus, oral mucosa moist Cardiovascular: No JVD.  S1-S2 present, rhythmic, no gallops, rubs, or murmurs. No lower extremity edema. Pulmonary: positive breath sounds bilaterally, , no wheezing. Gastrointestinal. Abdomen soft and non tender Skin. No rashes Musculoskeletal: no joint deformities     Data Reviewed: I have personally reviewed following labs and imaging studies  CBC: Recent Labs  Lab 10/03/19 0308 10/04/19 0257 10/05/19 0300 10/06/19 0238 10/08/19 0321  WBC 6.8 6.5 8.5 16.2* 10.8*  NEUTROABS 5.5 5.5 7.4 15.3* 10.1*  HGB 13.2 12.5 12.8 13.2 11.7*  HCT 41.4 39.5 40.2 40.5 36.7  MCV 86.8 86.6 86.6 86.2 86.4  PLT 201 212 232 273 621   Basic Metabolic Panel: Recent Labs  Lab 10/03/19 0308 10/03/19 0308 10/04/19 0257 10/04/19 0257 10/05/19 0300 10/06/19 0238 10/07/19 0243 10/08/19 0321 10/09/19 0242  NA 140   < > 141   < > 140 134* 136 134* 132*  K 4.3   < > 4.3   < > 4.4 4.4 4.2 4.2 4.0  CL 107   < > 104   < > 102 99 96* 96* 95*  CO2 22   < > 27   < > 29 27 29 28 26   GLUCOSE 186*   < > 197*   < > 151* 186* 170* 173* 173*  BUN 24*   < > 23*   < > 21*  18 20 17 18   CREATININE 0.89   < > 0.81   < > 0.67 0.75 0.69 0.57 0.71  CALCIUM 8.0*   < > 7.7*   < > 7.8* 7.5* 7.9* 8.3* 8.3*  MG 2.5*  --  2.4  --  2.8* 2.5*  --   --   --    < > = values in this interval not displayed.   GFR: Estimated Creatinine Clearance: 132.9 mL/min (by C-G formula based on SCr of 0.71 mg/dL). Liver Function Tests: Recent Labs  Lab 10/05/19 0300 10/06/19 0238 10/07/19 0243 10/08/19 0321 10/09/19 0242  AST 61* 49* 42* 44* 34  ALT 70* 57* 47* 57* 50*  ALKPHOS 64 64 63 58 59  BILITOT 1.1 1.5* 1.2 1.3* 1.5*  PROT 5.8* 5.9* 6.2* 6.1* 6.2*  ALBUMIN 2.8* 2.8* 2.8* 2.5* 2.7*   No results for input(s): LIPASE, AMYLASE in the last 168 hours. No results for input(s): AMMONIA in the last 168 hours. Coagulation Profile: No results for input(s): INR, PROTIME in the last 168 hours. Cardiac Enzymes: No results for input(s): CKTOTAL,  CKMB, CKMBINDEX, TROPONINI in the last 168 hours. BNP (last 3 results) No results for input(s): PROBNP in the last 8760 hours. HbA1C: No results for input(s): HGBA1C in the last 72 hours. CBG: Recent Labs  Lab 10/07/19 2055 10/08/19 0800 10/08/19 1231 10/08/19 1644 10/08/19 2145  GLUCAP 212* 189* 215* 173* 178*   Lipid Profile: No results for input(s): CHOL, HDL, LDLCALC, TRIG, CHOLHDL, LDLDIRECT in the last 72 hours. Thyroid Function Tests: No results for input(s): TSH, T4TOTAL, FREET4, T3FREE, THYROIDAB in the last 72 hours. Anemia Panel: Recent Labs    10/08/19 0321 10/09/19 0242  FERRITIN 417* 372*      Radiology Studies: I have reviewed all of the imaging during this hospital visit personally     Scheduled Meds: . aspirin EC  81 mg Oral Daily  . baricitinib  4 mg Oral Daily  . Chlorhexidine Gluconate Cloth  6 each Topical Daily  . chlorpheniramine-HYDROcodone  5 mL Oral Q12H  . enoxaparin (LOVENOX) injection  1 mg/kg Subcutaneous Q12H  . insulin aspart  0-20 Units Subcutaneous TID WC  . insulin detemir  10 Units Subcutaneous BID  . Ipratropium-Albuterol  1 puff Inhalation Q6H  . levothyroxine  175 mcg Oral QAC breakfast  . linagliptin  5 mg Oral Daily  . liothyronine  5 mcg Oral Daily  . mouth rinse  15 mL Mouth Rinse BID  . methylPREDNISolone (SOLU-MEDROL) injection  60 mg Intravenous Q8H  . montelukast  10 mg Oral Daily  . oxybutynin  10 mg Oral Daily  . oxyCODONE  10 mg Oral Q12H  . pantoprazole  40 mg Oral Daily  . pyridostigmine  60 mg Oral QID   Continuous Infusions: . sodium chloride Stopped (09/19/2019 1632)     LOS: 9 days        Dewanda Fennema Gerome Apley, MD

## 2019-10-10 ENCOUNTER — Inpatient Hospital Stay (HOSPITAL_COMMUNITY): Payer: Commercial Managed Care - PPO

## 2019-10-10 LAB — URINALYSIS, ROUTINE W REFLEX MICROSCOPIC
Bilirubin Urine: NEGATIVE
Glucose, UA: NEGATIVE mg/dL
Hgb urine dipstick: NEGATIVE
Ketones, ur: NEGATIVE mg/dL
Leukocytes,Ua: NEGATIVE
Nitrite: NEGATIVE
Protein, ur: 30 mg/dL — AB
Specific Gravity, Urine: 1.024 (ref 1.005–1.030)
pH: 6 (ref 5.0–8.0)

## 2019-10-10 LAB — BLOOD GAS, ARTERIAL
Acid-Base Excess: 5.7 mmol/L — ABNORMAL HIGH (ref 0.0–2.0)
Bicarbonate: 30.9 mmol/L — ABNORMAL HIGH (ref 20.0–28.0)
Drawn by: 23281
FIO2: 100
O2 Saturation: 86.2 %
Patient temperature: 100
pCO2 arterial: 51.2 mmHg — ABNORMAL HIGH (ref 32.0–48.0)
pH, Arterial: 7.402 (ref 7.350–7.450)
pO2, Arterial: 61.5 mmHg — ABNORMAL LOW (ref 83.0–108.0)

## 2019-10-10 LAB — FERRITIN: Ferritin: 474 ng/mL — ABNORMAL HIGH (ref 11–307)

## 2019-10-10 LAB — COMPREHENSIVE METABOLIC PANEL
ALT: 43 U/L (ref 0–44)
AST: 32 U/L (ref 15–41)
Albumin: 2.6 g/dL — ABNORMAL LOW (ref 3.5–5.0)
Alkaline Phosphatase: 58 U/L (ref 38–126)
Anion gap: 12 (ref 5–15)
BUN: 16 mg/dL (ref 6–20)
CO2: 30 mmol/L (ref 22–32)
Calcium: 8.3 mg/dL — ABNORMAL LOW (ref 8.9–10.3)
Chloride: 95 mmol/L — ABNORMAL LOW (ref 98–111)
Creatinine, Ser: 0.7 mg/dL (ref 0.44–1.00)
GFR calc Af Amer: >60 mL/min (ref >60)
GFR calc non Af Amer: >60 mL/min (ref >60)
Glucose, Bld: 144 mg/dL — ABNORMAL HIGH (ref 70–99)
Potassium: 4.7 mmol/L (ref 3.5–5.1)
Sodium: 137 mmol/L (ref 135–145)
Total Bilirubin: 1.2 mg/dL (ref 0.3–1.2)
Total Protein: 6.3 g/dL — ABNORMAL LOW (ref 6.5–8.1)

## 2019-10-10 LAB — GLUCOSE, CAPILLARY
Glucose-Capillary: 166 mg/dL — ABNORMAL HIGH (ref 70–99)
Glucose-Capillary: 123 mg/dL — ABNORMAL HIGH (ref 70–99)
Glucose-Capillary: 120 mg/dL — ABNORMAL HIGH (ref 70–99)
Glucose-Capillary: 127 mg/dL — ABNORMAL HIGH (ref 70–99)

## 2019-10-10 LAB — D-DIMER, QUANTITATIVE: D-Dimer, Quant: 3.65 ug/mL-FEU — ABNORMAL HIGH (ref 0.00–0.50)

## 2019-10-10 LAB — C-REACTIVE PROTEIN: CRP: 15.9 mg/dL — ABNORMAL HIGH (ref <1.0)

## 2019-10-10 MED ORDER — CHLORHEXIDINE GLUCONATE 0.12 % MT SOLN
15.0000 mL | Freq: Two times a day (BID) | OROMUCOSAL | Status: DC
  Administered 2019-10-10 – 2019-10-11 (×3): 15 mL via OROMUCOSAL
  Filled 2019-10-10: qty 15

## 2019-10-10 MED ORDER — LEVOTHYROXINE SODIUM 100 MCG/5ML IV SOLN: 80.0000 ug | Freq: Every day | INTRAVENOUS | Status: DC

## 2019-10-10 MED ORDER — ACETAMINOPHEN 650 MG RE SUPP
650.0000 mg | RECTAL | Status: DC | PRN
  Administered 2019-10-11: 650 mg via RECTAL
  Filled 2019-10-10: qty 1

## 2019-10-10 MED ORDER — LORAZEPAM 2 MG/ML IJ SOLN
0.5000 mg | Freq: Once | INTRAMUSCULAR | Status: AC
  Administered 2019-10-10: 0.5 mg via INTRAVENOUS

## 2019-10-10 MED ORDER — ORAL CARE MOUTH RINSE
15.0000 mL | Freq: Two times a day (BID) | OROMUCOSAL | Status: DC
  Administered 2019-10-10 – 2019-10-11 (×4): 15 mL via OROMUCOSAL

## 2019-10-10 MED ORDER — LORAZEPAM 2 MG/ML IJ SOLN
0.5000 mg | INTRAMUSCULAR | Status: DC | PRN
  Administered 2019-10-10 (×2): 0.5 mg via INTRAVENOUS
  Filled 2019-10-10 (×3): qty 1

## 2019-10-10 MED ORDER — PYRIDOSTIGMINE BROMIDE 10 MG/2ML IV SOLN
2.0000 mg | Freq: Four times a day (QID) | INTRAVENOUS | Status: DC
  Administered 2019-10-10 – 2019-10-11 (×5): 2 mg via INTRAVENOUS
  Filled 2019-10-10 (×7): qty 0.4

## 2019-10-10 MED ORDER — ENOXAPARIN SODIUM 150 MG/ML ~~LOC~~ SOLN
1.0000 mg/kg | Freq: Two times a day (BID) | SUBCUTANEOUS | Status: DC
  Administered 2019-10-10 – 2019-10-11 (×3): 135 mg via SUBCUTANEOUS
  Filled 2019-10-10 (×4): qty 0.9

## 2019-10-10 NOTE — Progress Notes (Signed)
Called pt's daughter and provided an update on her mother's condition.  Explained the patient is very sick and currently on a BiPAP machine.  Also explained patient continues to remove her mask which causes her 02 levels to decrease significantly.  Patient recently removed her mask and mitts were applied.  All concerns of the pt's daughter were addressed.

## 2019-10-10 NOTE — Progress Notes (Signed)
Abg obtained while pt on HHFNC 65L 100% with NRB.  Sample sent to lab for processing.

## 2019-10-10 NOTE — Progress Notes (Signed)
eLink Physician-Brief Progress Note Patient Name: Diane Flowers DOB: February 23, 1970 MRN: [id number]   Date of Service  10/10/2019  HPI/Events of Note  Request for med for anxiety as patient desaturating Patient seen asleep but sats 70s on heated HFNC and NRB mask  Previous CXR with pneumomediastinum  eICU Interventions  Will get CXR to make sure theres no development of pneumothorax before placing patient on NIPPV Obtain ABG     Intervention Category Major Interventions: Hypoxemia - evaluation and management  Shona Needles Kenady Doxtater 10/10/2019, 1:18 AM

## 2019-10-10 NOTE — Progress Notes (Addendum)
PROGRESS NOTE    Diane Flowers  ZOX:[id number] DOB: [birth year] DOA: 09/29/2019 PCP: Maggie Schwalbe, PA-C    Brief Narrative:  Patient admitted to the hospital with a working diagnosis of acute hypoxic respiratory failure due to SARS COVID-19 viral pneumonia.  49 year old female with past medical history for myasthenia gravis, splenic artery aneurysm, thyroid cancer/hypothyroidism, prediabetes and history of pancreatic tumor who presented with fevers, chills, loss of taste and cough. She was diagnosed with SARS COVID-19 on 09/24/19.On 9/15 she developed hypoxemia down to 80s. She initially was seen at the outpatient infusion clinic, supposed to get monoclonal antibodies,butdue to hypoxemia she was referred to the hospital. On her initial physical examination she was febrile 101 F, tachycardic 101 bpm, tachypneic 25 breaths/min, her blood pressure was 79/62, 136/79, oxygenation 92% on 6 L per nasal cannula. Her lungs had no wheezing, heart S1-S2, present tachycardic, abdomen was soft, no lower extremity edema. Positive right leg tenderness. Her chest radiograph had diffuse, bilateral essential infiltrates, correlated with CT chest.  Patient has been treated with systemic corticosteroids, remdesivir and perceived need. She had worsening hypoxemia, and development of pneumomediastinum.  Very high elevated D-dimer, she has been placed on full anticoagulation. Ultrasonography of the lower extremities with acute deep vein thrombosis involving the right posterior tibial vein,and acute deep vein thrombosis involving the left peroneal veins.  Patient with worsening hypoxemia and increasing oxygen requirements, on 100% Fi02.  She has decided not to be intubated.  Follow up chest film withdiffuse bilateral infiltrates more dense at the right lower and left upper lobe. She has developedpneumomediastinum and small apical right pneumothorax.Persistent cough and neck pain.  Patient has been  placed on opiates analgesics for neck pain.   09/26 Patient with increased work of breathing and placed on non invasive mechanical ventilation NIV Pressure control.    Assessment & Plan:   Principal Problem:   Acute hypoxemic respiratory failure due to COVID-19 [facility]) Active Problems:   Hypothyroid   Myasthenia gravis ([place])   DVT of lower extremity, bilateral (HCC)   Pneumonia due to COVID-19 virus    Sepsis ruled out, patient's condition due to severe viral pneumonia.  1.Acute hypoxic respiratory failure due to SARS COVID-19 viral pneumonia/ severe ARDS/ pneumomediastinum.#5 remdesivir  RR: 21 Pulse oxymetry: 72 to 84%  Fi02: NIV pressure control PEEP 6, Pressure 16 (10 above peep) 100% Fi02, tV 800 to 900 ml with MV 22  COVID-19 Labs  Recent Labs    10/08/19 0321 10/09/19 0242 10/10/19 0354  DDIMER 3.64* 3.11* 3.65*  FERRITIN 417* 372* 474*  CRP 17.4* 11.5* 15.9*    Lab Results  Component Value Date   SARSCOV2NAA POSITIVE (A) 10/02/2019    Patient now on non invasive mechanical ventilation pressure control, she looks comfortable with decreased work of breathing. Complains of being anxious, her pain and cough have improved. Chest film personally reviewed with bilateral infiltrates, interstitial and alveolar, bilateral lower lobes and left upper lobe predominantly. ABG  Pa02/Fi02 down to 61.  Plan to continue mechanical ventilation for the next 24 H, aggressive medical therapy with high dose Methylprednisolone, now not able to get po meds, baricitinib on hold.  Continue pain control with morphine and will add low dose lorazepam to control anxiety and allow non invasive mechanical ventilation.   If patient develops increase work of breathing or altered mentation, will need to address further goals of care.   Continue to follow inflammatory markers and chest film in the am.   2. Bilateral  acute lower extremity DVT.High pre test probability for PE.  Echocardiogram with preserved LV and RV function. Now on mechanical ventilation will use enoxaparin full dose until able to tolerate po again.   3. Myasthenia gravis.continue with pyridostigmine if possible. No current signs of exacerbation.  4. Prediabetes and obesity class 3. fasting glucose 173, continue current insulin regime with levimir and insulin sliding scale.   5. Hypothyroid.will change levothyroxine to IV  6. Perineum pressure ulcer(unable to assess stage).Continue local skin care  Patient critically will due to hypoxemic respiratory failure with imminent risk of worsening will continue medical therapy to prevent deterioration.  Critical care time 45 minutes  Status is: Inpatient  Remains inpatient appropriate because:IV treatments appropriate due to intensity of illness or inability to take PO/ mechanical ventilation   Dispo:  Patient From: Home  Planned Disposition: Home with Health Care Svc  Expected discharge date:  Medically stable for discharge: No   DVT prophylaxis: Enoxaparin   Code Status:   Do not intubate   Family Communication:   I spoke over the phone with the patient's daughter about patient's  condition, plan of care, prognosis and all questions were addressed.    Nutrition Status:           Skin Documentation: Pressure Injury 10/01/19 Perineum Medial MASD (Active)  10/01/19 2100  Location: Perineum  Location Orientation: Medial  Staging:   Wound Description (Comments): MASD  Present on Admission: Yes     Consultants:   Pulmonary   Subjective: Patient is awake and alert, her pain has improved, no nausea or vomiting, now on full face mask for non invasive mechanical ventilation. Positive anxiety   Objective: Vitals:   10/10/19 0329 10/10/19 0400 10/10/19 0600 10/10/19 0700  BP: 127/71 135/62 132/60   Pulse: 82 80 70 97  Resp: 20 17 15  (!) 21  Temp:  99.5 F (37.5 C)    TempSrc:  Oral    SpO2: (!) 85% (!) 82% (!)  84% (!) 72%  Weight:      Height:        Intake/Output Summary (Last 24 hours) at 10/10/2019 0816 Last data filed at 10/10/2019 0539 Gross per 24 hour  Intake --  Output 1350 ml  Net -1350 ml   Filed Weights   10/01/2019 1335 10/01/19 1500  Weight: 136.1 kg 135.2 kg    Examination:   General: deconditioned and ill looking appearing  Neurology: Awake and alert, non focal  E ENT: no pallor, no icterus, oral mucosa moist/ no accessory muscle use.  Cardiovascular: No JVD. S1-S2 present, rhythmic, no gallops, rubs, or murmurs. No lower extremity edema. Pulmonary: positive breath sounds bilaterally,  Gastrointestinal. Abdomen soft and non tender Skin. No rashes Musculoskeletal: no joint deformities     Data Reviewed: I have personally reviewed following labs and imaging studies  CBC: Recent Labs  Lab 10/04/19 0257 10/05/19 0300 10/06/19 0238 10/08/19 0321  WBC 6.5 8.5 16.2* 10.8*  NEUTROABS 5.5 7.4 15.3* 10.1*  HGB 12.5 12.8 13.2 11.7*  HCT 39.5 40.2 40.5 36.7  MCV 86.6 86.6 86.2 86.4  PLT 212 232 273 920   Basic Metabolic Panel: Recent Labs  Lab 10/04/19 0257 10/04/19 0257 10/05/19 0300 10/05/19 0300 10/06/19 0238 10/07/19 0243 10/08/19 0321 10/09/19 0242 10/10/19 0354  NA 141   < > 140   < > 134* 136 134* 132* 137  K 4.3   < > 4.4   < > 4.4 4.2 4.2 4.0 4.7  CL  104   < > 102   < > 99 96* 96* 95* 95*  CO2 27   < > 29   < > 27 29 28 26 30   GLUCOSE 197*   < > 151*   < > 186* 170* 173* 173* 144*  BUN 23*   < > 21*   < > 18 20 17 18 16   CREATININE 0.81   < > 0.67   < > 0.75 0.69 0.57 0.71 0.70  CALCIUM 7.7*   < > 7.8*   < > 7.5* 7.9* 8.3* 8.3* 8.3*  MG 2.4  --  2.8*  --  2.5*  --   --   --   --    < > = values in this interval not displayed.   GFR: Estimated Creatinine Clearance: 132.9 mL/min (by C-G formula based on SCr of 0.7 mg/dL). Liver Function Tests: Recent Labs  Lab 10/06/19 0238 10/07/19 0243 10/08/19 0321 10/09/19 0242 10/10/19 0354  AST 49*  42* 44* 34 32  ALT 57* 47* 57* 50* 43  ALKPHOS 64 63 58 59 58  BILITOT 1.5* 1.2 1.3* 1.5* 1.2  PROT 5.9* 6.2* 6.1* 6.2* 6.3*  ALBUMIN 2.8* 2.8* 2.5* 2.7* 2.6*   No results for input(s): LIPASE, AMYLASE in the last 168 hours. No results for input(s): AMMONIA in the last 168 hours. Coagulation Profile: No results for input(s): INR, PROTIME in the last 168 hours. Cardiac Enzymes: No results for input(s): CKTOTAL, CKMB, CKMBINDEX, TROPONINI in the last 168 hours. BNP (last 3 results) No results for input(s): PROBNP in the last 8760 hours. HbA1C: No results for input(s): HGBA1C in the last 72 hours. CBG: Recent Labs  Lab 10/08/19 2145 10/09/19 0835 10/09/19 1248 10/09/19 1545 10/09/19 2129  GLUCAP 178* 148* 200* 102* 155*   Lipid Profile: No results for input(s): CHOL, HDL, LDLCALC, TRIG, CHOLHDL, LDLDIRECT in the last 72 hours. Thyroid Function Tests: No results for input(s): TSH, T4TOTAL, FREET4, T3FREE, THYROIDAB in the last 72 hours. Anemia Panel: Recent Labs    10/09/19 0242 10/10/19 0354  FERRITIN 372* 474*      Radiology Studies: I have reviewed all of the imaging during this hospital visit personally     Scheduled Meds: . aspirin EC  81 mg Oral Daily  . baricitinib  4 mg Oral Daily  . chlorhexidine  15 mL Mouth Rinse BID  . Chlorhexidine Gluconate Cloth  6 each Topical Daily  . chlorpheniramine-HYDROcodone  5 mL Oral Q12H  . insulin aspart  0-20 Units Subcutaneous TID WC  . insulin detemir  10 Units Subcutaneous BID  . Ipratropium-Albuterol  1 puff Inhalation Q6H  . levothyroxine  175 mcg Oral QAC breakfast  . linagliptin  5 mg Oral Daily  . liothyronine  5 mcg Oral Daily  . mouth rinse  15 mL Mouth Rinse q12n4p  . methylPREDNISolone (SOLU-MEDROL) injection  60 mg Intravenous Q8H  . montelukast  10 mg Oral Daily  . oxybutynin  10 mg Oral Daily  . oxyCODONE  10 mg Oral Q12H  . pantoprazole  40 mg Oral Daily  . pyridostigmine  60 mg Oral QID  .  Rivaroxaban  15 mg Oral BID WC   Followed by  . [START ON 10/30/2019] rivaroxaban  20 mg Oral Q supper   Continuous Infusions: . sodium chloride Stopped (10/13/2019 1632)     LOS: 10 days        Aleia Larocca Gerome Apley, MD

## 2019-10-10 NOTE — Progress Notes (Addendum)
ANTICOAGULATION CONSULT NOTE - Initial Consult  Pharmacy Consult for Lovenox Indication: DVT  Allergies  Allergen Reactions  . Flu Virus Vaccine Anaphylaxis  . Sulfa Antibiotics Anaphylaxis  . Sulfamethoxazole Anaphylaxis  . Scopolamine Swelling    Patient Measurements: Height: 6' (182.9 cm) Weight: 135.2 kg (298 lb 1 oz) IBW/kg (Calculated) : 73.1  Vital Signs: Temp: 99.3 F (37.4 C) (09/26 0815) Temp Source: Axillary (09/26 0815) BP: 142/77 (09/26 0800) Pulse Rate: 98 (09/26 0800)  Labs: Recent Labs    10/08/19 0321 10/09/19 0242 10/10/19 0354  HGB 11.7*  --   --   HCT 36.7  --   --   PLT 307  --   --   CREATININE 0.57 0.71 0.70    Estimated Creatinine Clearance: 132.9 mL/min (by C-G formula based on SCr of 0.7 mg/dL).   Medical History: Past Medical History:  Diagnosis Date  . Hypothyroidism   . Myasthenia gravis ([place])   . Tumor of thyroid     Assessment: [name] admitted with COVID PNA developed DVT, possible PE. Pharmacy consulted to assist in transitioning from full-dose Lovenox to Xarelto. Last dose of Lovenox administered this AM.   9/26 AM placed on NIV with pressure control so can't take oral meds.  Pharmacy consulted to transition to Lovenox. Last dose of rivaroxaban was last PM.   Goal of Therapy:  Monitor platelets by anticoagulation protocol: Yes   Plan:  Initiate Lovenox 1 mg/kg q 12 hours  Will continue to monitor remotely and sing off note writing   Thank you for the consult   Ulice Dash D 10/10/2019,9:55 AM

## 2019-10-11 ENCOUNTER — Inpatient Hospital Stay (HOSPITAL_COMMUNITY): Payer: Commercial Managed Care - PPO

## 2019-10-11 LAB — COMPREHENSIVE METABOLIC PANEL
ALT: 39 U/L (ref 0–44)
AST: 29 U/L (ref 15–41)
Albumin: 2.6 g/dL — ABNORMAL LOW (ref 3.5–5.0)
Alkaline Phosphatase: 62 U/L (ref 38–126)
Anion gap: 14 (ref 5–15)
BUN: 18 mg/dL (ref 6–20)
CO2: 27 mmol/L (ref 22–32)
Calcium: 8.6 mg/dL — ABNORMAL LOW (ref 8.9–10.3)
Chloride: 98 mmol/L (ref 98–111)
Creatinine, Ser: 0.92 mg/dL (ref 0.44–1.00)
GFR calc Af Amer: >60 mL/min (ref >60)
GFR calc non Af Amer: >60 mL/min (ref >60)
Glucose, Bld: 180 mg/dL — ABNORMAL HIGH (ref 70–99)
Potassium: 4.8 mmol/L (ref 3.5–5.1)
Sodium: 139 mmol/L (ref 135–145)
Total Bilirubin: 1.2 mg/dL (ref 0.3–1.2)
Total Protein: 6.6 g/dL (ref 6.5–8.1)

## 2019-10-11 LAB — C-REACTIVE PROTEIN: CRP: 22.3 mg/dL — ABNORMAL HIGH (ref <1.0)

## 2019-10-11 LAB — MRSA PCR SCREENING: MRSA by PCR: NEGATIVE

## 2019-10-11 LAB — GLUCOSE, CAPILLARY
Glucose-Capillary: 172 mg/dL — ABNORMAL HIGH (ref 70–99)
Glucose-Capillary: 143 mg/dL — ABNORMAL HIGH (ref 70–99)

## 2019-10-11 LAB — FERRITIN: Ferritin: 525 ng/mL — ABNORMAL HIGH (ref 11–307)

## 2019-10-11 LAB — D-DIMER, QUANTITATIVE: D-Dimer, Quant: 4.86 ug/mL-FEU — ABNORMAL HIGH (ref 0.00–0.50)

## 2019-10-11 MED ORDER — POLYVINYL ALCOHOL 1.4 % OP SOLN
1.0000 [drp] | Freq: Four times a day (QID) | OPHTHALMIC | Status: DC | PRN
  Filled 2019-10-11: qty 15

## 2019-10-11 MED ORDER — LORAZEPAM 2 MG/ML IJ SOLN: 2.0000 mg | INTRAMUSCULAR | Status: DC | PRN

## 2019-10-11 MED ORDER — MORPHINE SULFATE (PF) 2 MG/ML IV SOLN
1.0000 mg | INTRAVENOUS | Status: DC | PRN
  Administered 2019-10-11 (×3): 1 mg via INTRAVENOUS
  Filled 2019-10-11: qty 1

## 2019-10-11 MED ORDER — LORAZEPAM 2 MG/ML PO CONC
1.0000 mg | ORAL | Status: DC | PRN
  Filled 2019-10-11: qty 0.5

## 2019-10-11 MED ORDER — GLYCOPYRROLATE 0.2 MG/ML IJ SOLN: 0.2000 mg | INTRAMUSCULAR | Status: DC | PRN

## 2019-10-11 MED ORDER — DEXTROSE 5 % IV SOLN: INTRAVENOUS | Status: DC

## 2019-10-11 MED ORDER — MORPHINE 100MG IN NS 100ML (1MG/ML) PREMIX INFUSION
10.0000 mg/h | INTRAVENOUS | Status: DC
  Administered 2019-10-11: 10 mg/h via INTRAVENOUS
  Filled 2019-10-11 (×2): qty 100

## 2019-10-11 MED ORDER — BIOTENE DRY MOUTH MT LIQD: 15.0000 mL | OROMUCOSAL | Status: DC | PRN

## 2019-10-11 MED ORDER — HALOPERIDOL 1 MG PO TABS: 0.5000 mg | ORAL_TABLET | ORAL | Status: DC | PRN

## 2019-10-11 MED ORDER — GLYCOPYRROLATE 1 MG PO TABS: 1.0000 mg | ORAL_TABLET | ORAL | Status: DC | PRN

## 2019-10-11 MED ORDER — MORPHINE BOLUS VIA INFUSION
2.0000 mg | INTRAVENOUS | Status: DC | PRN
  Administered 2019-10-11: 2 mg via INTRAVENOUS
  Filled 2019-10-11: qty 2

## 2019-10-11 MED ORDER — MORPHINE BOLUS VIA INFUSION
5.0000 mg | INTRAVENOUS | Status: DC | PRN
  Administered 2019-10-11: 2 mg via INTRAVENOUS
  Administered 2019-10-12: 5 mg via INTRAVENOUS
  Filled 2019-10-11: qty 5

## 2019-10-11 MED ORDER — ACETAMINOPHEN 325 MG PO TABS: 650.0000 mg | ORAL_TABLET | Freq: Four times a day (QID) | ORAL | Status: DC | PRN

## 2019-10-11 MED ORDER — VANCOMYCIN HCL 10 G IV SOLR
2500.0000 mg | Freq: Once | INTRAVENOUS | Status: AC
  Administered 2019-10-11: 2500 mg via INTRAVENOUS
  Filled 2019-10-11: qty 2500

## 2019-10-11 MED ORDER — DEXMEDETOMIDINE HCL IN NACL 400 MCG/100ML IV SOLN
0.4000 ug/kg/h | INTRAVENOUS | Status: DC
  Administered 2019-10-11: 0.6 ug/kg/h via INTRAVENOUS
  Administered 2019-10-11: 0.8 ug/kg/h via INTRAVENOUS
  Administered 2019-10-11 (×2): 1.2 ug/kg/h via INTRAVENOUS
  Administered 2019-10-11: 0.5 ug/kg/h via INTRAVENOUS
  Administered 2019-10-11: 1.2 ug/kg/h via INTRAVENOUS
  Administered 2019-10-11: 0.4 ug/kg/h via INTRAVENOUS
  Administered 2019-10-12: 0.8 ug/kg/h via INTRAVENOUS
  Administered 2019-10-12: 0.9 ug/kg/h via INTRAVENOUS
  Filled 2019-10-11 (×9): qty 100

## 2019-10-11 MED ORDER — HALOPERIDOL LACTATE 2 MG/ML PO CONC
0.5000 mg | ORAL | Status: DC | PRN
  Filled 2019-10-11: qty 0.3

## 2019-10-11 MED ORDER — METHYLPREDNISOLONE SODIUM SUCC 125 MG IJ SOLR: 60.0000 mg | Freq: Two times a day (BID) | INTRAMUSCULAR | Status: DC

## 2019-10-11 MED ORDER — LORAZEPAM 1 MG PO TABS: 1.0000 mg | ORAL_TABLET | ORAL | Status: DC | PRN

## 2019-10-11 MED ORDER — ONDANSETRON HCL 4 MG/2ML IJ SOLN: 4.0000 mg | Freq: Four times a day (QID) | INTRAMUSCULAR | Status: DC | PRN

## 2019-10-11 MED ORDER — ACETAMINOPHEN 650 MG RE SUPP: 650.0000 mg | Freq: Four times a day (QID) | RECTAL | Status: DC | PRN

## 2019-10-11 MED ORDER — SODIUM CHLORIDE 0.9 % IV SOLN
2.0000 g | Freq: Three times a day (TID) | INTRAVENOUS | Status: DC
  Administered 2019-10-11: 2 g via INTRAVENOUS
  Filled 2019-10-11: qty 2

## 2019-10-11 MED ORDER — LORAZEPAM 2 MG/ML IJ SOLN
1.0000 mg | INTRAMUSCULAR | Status: DC | PRN
  Administered 2019-10-11: 1 mg via INTRAVENOUS
  Filled 2019-10-11: qty 1

## 2019-10-11 MED ORDER — MORPHINE 100MG IN NS 100ML (1MG/ML) PREMIX INFUSION
0.0000 mg/h | INTRAVENOUS | Status: DC
  Administered 2019-10-11 – 2019-10-12 (×2): 15 mg/h via INTRAVENOUS
  Filled 2019-10-11 (×4): qty 100

## 2019-10-11 MED ORDER — MORPHINE SULFATE (PF) 2 MG/ML IV SOLN: 2.0000 mg | INTRAVENOUS | Status: DC | PRN

## 2019-10-11 MED ORDER — PANTOPRAZOLE SODIUM 40 MG IV SOLR
40.0000 mg | Freq: Every day | INTRAVENOUS | Status: DC
  Administered 2019-10-11: 40 mg via INTRAVENOUS
  Filled 2019-10-11: qty 40

## 2019-10-11 MED ORDER — ONDANSETRON 4 MG PO TBDP: 4.0000 mg | ORAL_TABLET | Freq: Four times a day (QID) | ORAL | Status: DC | PRN

## 2019-10-11 MED ORDER — LIP MEDEX EX OINT
TOPICAL_OINTMENT | CUTANEOUS | Status: AC
  Administered 2019-10-11: 1
  Filled 2019-10-11: qty 7

## 2019-10-11 MED ORDER — DIPHENHYDRAMINE HCL 50 MG/ML IJ SOLN: 25.0000 mg | INTRAMUSCULAR | Status: DC | PRN

## 2019-10-11 MED ORDER — HALOPERIDOL LACTATE 5 MG/ML IJ SOLN: 0.5000 mg | INTRAMUSCULAR | Status: DC | PRN

## 2019-10-11 MED ORDER — POLYVINYL ALCOHOL 1.4 % OP SOLN: 1.0000 [drp] | Freq: Four times a day (QID) | OPHTHALMIC | Status: DC | PRN

## 2019-10-11 NOTE — Progress Notes (Signed)
This RN provided oral care to the patient under her bipap mask when patient removed safety mitts, tore bipap mask into pieces and began to swing arms at RN. RN attempted to replace bipap mask without success and called for staff assistance. O2 dropped to 30%, heartrate brady to 40. Bipap mask successfully replaced and patient's safety maintained. Sats improved and RN stayed in room to ensure patient comfortable before leaving room. MD notified, family called bedside to see patient.

## 2019-10-11 NOTE — Progress Notes (Signed)
NAME:  Diane Flowers, MRN:  [id number], DOB:  05/22/1970, LOS: 62 ADMISSION DATE:  10/09/2019, CONSULTATION DATE: 10/04/19 REFERRING MD: Dr. Sloan Leiter, CHIEF COMPLAINT: Epistaxis   Brief History   [name], unvaccinated female (prior hx of anaphylaxis with flu vaccine), admitted 9/16 with hypoxic respiratory failure in the setting of COVID PNA.  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 Now on BIPAP  Consults:  Palliative care  Procedures:    Significant Diagnostic Tests:   CT Angio Chest 9/16 >> no central pulmonary embolism, extensive multifocal patchy airspace opacities   LE Vascular Duplex 9/17 >> negative for DVT bilaterally  LE Vascular Duplex 9/21 >> B/L DVT  Echo 9/21 >> EF 60 to 65%, grade 1 DD, normal RV function  Micro Data:  COVID 9/16 >> positive  Blood 9/16 >> negative MRSA screen 9/17 >> negative  Antimicrobials/COVID Rx  Baricitinib 9/17 >> Solu-Medrol 9/17 >> Remdesivir 9/16- 9/20 vanc 9/27 Cefepime 9/27 Interim history/subjective:   More hypoxic  Objective   Blood pressure 128/83, pulse 86, temperature 100 F (37.8 C), temperature source Axillary, resp. rate 18, height 6' (1.829 m), weight 135.2 kg, SpO2 (Abnormal) 80 %.    Vent Mode: BIPAP FiO2 (%):  [100 %] 100 % Set Rate:  [10 bmp] 10  bmp PEEP:  [6 cmH20] 6 cmH20   Intake/Output Summary (Last 24 hours) at 10/11/2019 0814 Last data filed at 10/11/2019 0645 Gross per 24 hour  Intake 76.27 ml  Output 230 ml  Net -153.73 ml   Filed Weights   10/11/2019 1335 10/01/19 1500  Weight: 136.1 kg 135.2 kg    Examination: General now sedated on precedex.  HENT NCAT no JVD; BIPAP in place Pulm decreased t/o. + accessory use. Peep 8/100% FIO2->sats are 82% Card rrr abd not tender Ext warm Neuro sedated  Resolved Hospital Problem list   Epistaxis  Assessment & Plan:   Acute hypoxic respiratory failure from COVID 19 pneumonia complicated by pneumomediastinum. Hx of asthma. - completed remdesivir -CRP actually climbing.  -her cxr is under-penetrated. There is dense bilateral consolidative lung disease in bases in addition to diffuse airspace disease. On going penumomediastinum. Not changed. Overall aeration however much worse.  -in setting of fever spike and  Plan Cont to alternate high flow/face mask w/ BIPAP Accept pulse ox 85 or greater Day 10 steroids->will start taper (has not helped) Day 10 of 14 Baricitinib  Ck PCT, cbc If either elevated think little harm in empiric abx IV lasix as BP/BUN/cr allow (she is -3.7L)  Acute delirium Plan precedex  Treat hypoxia Decrease steroids  Acute bilateral lower extremity DVTs. Plan Cont LMWH   Steroid induced hyperglycemia. - glucose in goal Plan Glycemic regimen per triad  Hx of hypothyroidism. Plan Cont synthroid and cytomel    Hx of myasthenia gravis. Plan Cont Mestinon   Goals of care. - palliative care consulted->seems  to have disconnect about NOT wanting intubation BUT wanting ACLS and heroic events in the case of cardiac arrest Plan Will cont to monitor.  I do not think we should offer ACLS w/ intubation off the table as an arrest would almost certainly be respiratory mediated.    Best practice:  Diet: carb modified, heart healthy DVT  prophylaxis: enoxaparin  GI prophylaxis: protonix Mobility: OOB to chair Code Status: No intubation Disposition: ICU   Erick Colace ACNP-BC [place] Pager # [phone] OR # [phone] if no answer

## 2019-10-11 NOTE — Progress Notes (Signed)
Patient continue to have a very poor prognosis. She has been placed on comfort measures and her family is at the bedside.  Full face mask has been removed and patient has been placed on heated high flow nasal cannula.   Continue morphine drip and dexmetomidine. Discontinue antibiotic therapy and steroids. Added haldol and lorazepam for further anxiety and respiratory distress.   Patient with imminent death, continue medical therapy to prevent any suffering or distress.

## 2019-10-11 NOTE — Progress Notes (Addendum)
PROGRESS NOTE    Diane Flowers  CNO:[id number] DOB: 04-11-1970 DOA: 10/10/2019 PCP: Maggie Schwalbe, PA-C    Brief Narrative:  Patient admitted to the hospital with a working diagnosis of acute hypoxic respiratory failure due to SARS COVID-19 viral pneumonia.  49 year old female with past medical history for myasthenia gravis, splenic artery aneurysm, thyroid cancer/hypothyroidism, prediabetes and history of pancreatic tumor who presented with fevers, chills, loss of taste and cough. She was diagnosed with SARS COVID-19 on 09/24/19.On 9/15 she developed hypoxemia down to 80s. She initially was seen at the outpatient infusion clinic, supposed to get monoclonal antibodies,butdue to hypoxemia she was referred to the hospital. On her initial physical examination she was febrile 101 F, tachycardic 101 bpm, tachypneic 25 breaths/min, her blood pressure was 79/62, 136/79, oxygenation 92% on 6 L per nasal cannula. Her lungs had no wheezing, heart S1-S2, present tachycardic, abdomen was soft, no lower extremity edema. Positive right leg tenderness. Her chest radiograph had diffuse, bilateral essential infiltrates, correlated with CT chest.  Patient has been treated with systemic corticosteroids, remdesivir and perceived need. She had worsening hypoxemia, and development of pneumomediastinum.  Very high elevated D-dimer, she has been placed on full anticoagulation. Ultrasonography of the lower extremities with acute deep vein thrombosis involving the right posterior tibial vein,and acute deep vein thrombosis involving the left peroneal veins.  Patient with worsening hypoxemia and increasing oxygen requirements, on 100% Fi02.  She has decided not to be intubated.  Follow up chest film withdiffuse bilateral infiltrates more dense at the right lower and left upper lobe. She has developedpneumomediastinum and small apical right pneumothorax.Persistent cough and neck pain.  Patient has been  placed on opiates analgesics for neck pain.  09/26 Patient with increased work of breathing and placed on non invasive mechanical ventilation NIV Pressure control.    Patient with severe agitation, trying to pull interface full mask, triggering severe oxygen desaturation and cyanosis. Started on Dexmedetomidne infusion for agitation.    Assessment & Plan:   Principal Problem:   Acute hypoxemic respiratory failure due to COVID-19 [facility]) Active Problems:   Hypothyroid   Myasthenia gravis ([place])   DVT of lower extremity, bilateral (HCC)   Pneumonia due to COVID-19 virus   Sepsis ruled out, patient's condition due to severe viral pneumonia.  1.Acute hypoxic respiratory failure due to SARS COVID-19 viral pneumonia/ severe ARDS/ pneumomediastinum.#5 remdesivir  RR: 20 Pulse oxymetry: 78 to 81%  Fi02: NIV PC Peep 8, Pressure 12 (6 above peep), Rate 10 with Fi02 100%, Vt 700 ml and MV 13.    COVID-19 Labs  Recent Labs    10/09/19 0242 10/10/19 0354 10/11/19 0259  DDIMER 3.11* 3.65* 4.86*  FERRITIN 372* 474* 525*  CRP 11.5* 15.9* 22.3*    Lab Results  Component Value Date   SARSCOV2NAA POSITIVE (A) 10/02/2019    Patient with worsening hypoxemia, unable to liberate from mechanical ventilator due to severe hypoxemia. Worsening inflammatory markers and worsening infiltrates on chest film, dense bilateral lower lobes and lower zone of upper lobes, persistent pneumomediastinum. Her mentation has worsened and now on dexemedetomidine infusion. Grimacing to pain stimuli.   Medical therapy with high dose systemic steroids, unable to get po medications or inhalers due to dependence on mechanical ventilation.  Add antibiotic therapy with cefepime for possible bacterial over-infection. Continue sedation for encephalopathy, (acute metabolic with features of delirium)  Patient has a poor prognosis and high risk for rapid deterioration.     2. Bilateral acute lower extremity DVT.High  pre test probability for PE.Echocardiogram with preserved LV and RV function. Continue anticoagulation with full dose enoxaparin.   3. Myasthenia gravis.IV pyridostigmine not able to tolerate po.   4. Prediabetes and obesity class 3.Glucose is 180, patient has been NPO due to worsening respiratory failure. Continue insulin therapy.   5. Hypothyroid.continue with levothyroxine IV per pharmacy protocol.   6. Perineum pressure ulcer(unable to assess stage). local skin care   Family communication:   I spoke over the phone with the patient's daughter.  I explained her critical condition and worsening respiratory failure high risk for cardiac arrest, likely attempt for resuscitation will prolonged suffering and create more pain, her prognosis is very poor.  She will talk to the rest of the family and will let us know about further goals of care.    Status is: Inpatient  Remains inpatient appropriate because:IV treatments appropriate due to intensity of illness or inability to take PO   Dispo:  Patient From: Home  Planned Disposition: Home with Health Care Svc  Expected discharge date:   Medically stable for discharge: No    DVT prophylaxis: Enoxaparin   Code Status:   DNI      Nutrition Status:           Skin Documentation: Pressure Injury 10/01/19 Perineum Medial MASD (Active)  10/01/19 2100  Location: Perineum  Location Orientation: Medial  Staging:   Wound Description (Comments): MASD  Present on Admission: Yes     Consultants:   Pulmonary   Subjective: Last nigh patient pulled full face mask with oxygen desaturation down to 30's, required sedation to place back on full face mask, non invasive mechanical ventilation. This am sedated grimacing to pain stimuli.   Objective: Vitals:   10/11/19 0400 10/11/19 0500 10/11/19 0600 10/11/19 0829  BP: 107/61  128/83   Pulse: (!) 103  86   Resp: 18     Temp:  100 F (37.8 C)    TempSrc:  Axillary      SpO2: (!) 84%  (!) 80% (!) 81%  Weight:      Height:        Intake/Output Summary (Last 24 hours) at 10/11/2019 0837 Last data filed at 10/11/2019 0645 Gross per 24 hour  Intake 76.27 ml  Output 230 ml  Net -153.73 ml   Filed Weights   10/03/2019 1335 10/01/19 1500  Weight: 136.1 kg 135.2 kg    Examination:   General: sedated, ill looking appearing very deconditioned  Neurology: Awake and alert, non focal  E ENT: no pallor, no icterus, oral mucosa moist Cardiovascular: No JVD. S1-S2 present, rhythmic, no gallops, rubs, or murmurs. No lower extremity edema. Pulmonary: positive breath sounds bilaterally Gastrointestinal. Abdomen soft and non distended Skin. Perineum pressure ulcer.  Musculoskeletal: no joint deformities     Data Reviewed: I have personally reviewed following labs and imaging studies  CBC: Recent Labs  Lab 10/05/19 0300 10/06/19 0238 10/08/19 0321  WBC 8.5 16.2* 10.8*  NEUTROABS 7.4 15.3* 10.1*  HGB 12.8 13.2 11.7*  HCT 40.2 40.5 36.7  MCV 86.6 86.2 86.4  PLT 232 273 885   Basic Metabolic Panel: Recent Labs  Lab 10/05/19 0300 10/05/19 0300 10/06/19 0238 10/06/19 0238 10/07/19 0243 10/08/19 0321 10/09/19 0242 10/10/19 0354 10/11/19 0259  NA 140   < > 134*   < > 136 134* 132* 137 139  K 4.4   < > 4.4   < > 4.2 4.2 4.0 4.7 4.8  CL 102   < >  99   < > 96* 96* 95* 95* 98  CO2 29   < > 27   < > 29 28 26 30 27   GLUCOSE 151*   < > 186*   < > 170* 173* 173* 144* 180*  BUN 21*   < > 18   < > 20 17 18 16 18   CREATININE 0.67   < > 0.75   < > 0.69 0.57 0.71 0.70 0.92  CALCIUM 7.8*   < > 7.5*   < > 7.9* 8.3* 8.3* 8.3* 8.6*  MG 2.8*  --  2.5*  --   --   --   --   --   --    < > = values in this interval not displayed.   GFR: Estimated Creatinine Clearance: 115.6 mL/min (by C-G formula based on SCr of 0.92 mg/dL). Liver Function Tests: Recent Labs  Lab 10/07/19 0243 10/08/19 0321 10/09/19 0242 10/10/19 0354 10/11/19 0259  AST 42* 44* 34 32 29   ALT 47* 57* 50* 43 39  ALKPHOS 63 58 59 58 62  BILITOT 1.2 1.3* 1.5* 1.2 1.2  PROT 6.2* 6.1* 6.2* 6.3* 6.6  ALBUMIN 2.8* 2.5* 2.7* 2.6* 2.6*   No results for input(s): LIPASE, AMYLASE in the last 168 hours. No results for input(s): AMMONIA in the last 168 hours. Coagulation Profile: No results for input(s): INR, PROTIME in the last 168 hours. Cardiac Enzymes: No results for input(s): CKTOTAL, CKMB, CKMBINDEX, TROPONINI in the last 168 hours. BNP (last 3 results) No results for input(s): PROBNP in the last 8760 hours. HbA1C: No results for input(s): HGBA1C in the last 72 hours. CBG: Recent Labs  Lab 10/10/19 0817 10/10/19 1221 10/10/19 1751 10/10/19 2152 10/11/19 0759  GLUCAP 166* 123* 120* 127* 172*   Lipid Profile: No results for input(s): CHOL, HDL, LDLCALC, TRIG, CHOLHDL, LDLDIRECT in the last 72 hours. Thyroid Function Tests: No results for input(s): TSH, T4TOTAL, FREET4, T3FREE, THYROIDAB in the last 72 hours. Anemia Panel: Recent Labs    10/10/19 0354 10/11/19 0259  FERRITIN 474* 525*      Radiology Studies: I have reviewed all of the imaging during this hospital visit personally     Scheduled Meds: . aspirin EC  81 mg Oral Daily  . baricitinib  4 mg Oral Daily  . chlorhexidine  15 mL Mouth Rinse BID  . Chlorhexidine Gluconate Cloth  6 each Topical Daily  . chlorpheniramine-HYDROcodone  5 mL Oral Q12H  . enoxaparin (LOVENOX) injection  1 mg/kg Subcutaneous BID  . insulin aspart  0-20 Units Subcutaneous TID WC  . insulin detemir  10 Units Subcutaneous BID  . Ipratropium-Albuterol  1 puff Inhalation Q6H  . [START ON 10/13/2019] levothyroxine  80 mcg Intravenous Daily  . linagliptin  5 mg Oral Daily  . mouth rinse  15 mL Mouth Rinse q12n4p  . methylPREDNISolone (SOLU-MEDROL) injection  60 mg Intravenous Q8H  . montelukast  10 mg Oral Daily  . oxybutynin  10 mg Oral Daily  . oxyCODONE  10 mg Oral Q12H  . pantoprazole  40 mg Oral Daily  .  pyridostigmine  2 mg Intravenous Q6H   Continuous Infusions: . sodium chloride Stopped (09/29/2019 1632)  . dexmedetomidine (PRECEDEX) IV infusion 0.5 mcg/kg/hr (10/11/19 0636)     LOS: 11 days        Carthel Castille Gerome Apley, MD

## 2019-10-11 NOTE — Progress Notes (Signed)
Pharmacy Antibiotic Note  Diane Flowers is a 49 y.o. female admitted on 09/19/2019 with Covid.  Pharmacy has been consulted for vancomycin & cefepime dosing for HCAP.  Tmax 101, WBC 10.8 (on solumedrol) Scr 0.92 CRP up to 22.3 Not getting baricitinib 2nd NPO status   Plan: Vancomycin 2500 mg IV loading dose Vancomycin 1250 IV every 12 hours.  Goal trough 15-20 mcg/mL.  For estimated AUC 486 using SCr 0.92 & Vd 0.5 Cefepime 2 gm IV q8h Re-check MRSA PCR (was negative on 9/17) F/u renal function, WBC, temp, clinical course Vancomycin trough as needed  Height: 6' (182.9 cm) Weight: 135.2 kg (298 lb 1 oz) IBW/kg (Calculated) : 73.1  Temp (24hrs), Avg:100.1 F (37.8 C), Min:99.6 F (37.6 C), Max:101 F (38.3 C)  Recent Labs  Lab 10/05/19 0300 10/05/19 0300 10/06/19 0238 10/06/19 0238 10/07/19 0243 10/08/19 0321 10/09/19 0242 10/10/19 0354 10/11/19 0259  WBC 8.5  --  16.2*  --   --  10.8*  --   --   --   CREATININE 0.67   < > 0.75   < > 0.69 0.57 0.71 0.70 0.92   < > = values in this interval not displayed.    Estimated Creatinine Clearance: 115.6 mL/min (by C-G formula based on SCr of 0.92 mg/dL).    Allergies  Allergen Reactions  . Flu Virus Vaccine Anaphylaxis  . Sulfa Antibiotics Anaphylaxis  . Sulfamethoxazole Anaphylaxis  . Scopolamine Swelling   Antimicrobials this admission:  9/16 Remdesivir>>9/20 9/17 baricitinib >> 9/30- on hold 9/26 for NIV/NPO 9/27 vanc >> 9/27 cefepime>> Dose adjustments this admission:   Microbiology results:  9/16 BCx x2: NGF 9/16 COVID+ 9/17 MRSA PCR: neg 9/27 MRSA PCR: ordered  Thank you for allowing pharmacy to be a part of this patient's care.   Eudelia Bunch, Pharm.D 10/11/2019 9:45 AM

## 2019-10-11 NOTE — Progress Notes (Signed)
Bipap mask removed to obtain ordered MRSA swab and provide oral care as able. Patient tolerated fair. O2 down to 50% but quickly returned to 80% back on bipap. Will continue to provide support.

## 2019-10-11 NOTE — Progress Notes (Addendum)
Family indicated to RN that they wanted to make the patient comfortable. This RN notified providers of family wishes and morphine drip ordered with boluses. Before the morphine drip was prepared by pharmacy, daughter requested to have bipap mask removed as patient was very uncomfortable appearing wearing it. This RN provided education on the likely outcome of removing bipap mask without proper sedation. Daughter agreed and still wanted bipap removed. This RN removed bipap and placed patient on heated high flow nasal cannula and non rebreather mask per daughter request. Patient became very agitated and swung arms at this RN and family bedside. Staff assistance was called into room to maintain safety of patient. Patient bolused with sedation medication (see eMAR) and more comfortable. This RN explained that usually minimal oxygen is worn for comfort patients and medications available to maintain comfort for the patient. Daughter made clear that she did not want the heated high flow and non rebreather mask removed at this time but did want medications given to keep the patient from suffering. This RN relayed this message to the MD. Comfort care orders initiated with oxygen remaining. Will continue to monitor for signs of discomfort or air hunger.

## 2019-10-11 NOTE — Progress Notes (Signed)
I spoke with the patient's husband and daughter. She is on maximum support from noninvasive ventilation. She will not survive this illness. It would be medically ineffective to provide ACLS in the setting of not providing mechanical ventilation for airway protection therefore we will not offer this intervention. I have explained this to the family and they understand. I have encouraged them to try to make their way to the hospital for an opportunity to say goodbye to Diane Flowers. We will continue the current level of support, but will not escalate. She is now a full DO NOT RESUSCITATE. I anticipate she will not survive through the afternoon  Erick Colace ACNP-BC [place] Pager # [phone] OR # [phone] if no answer

## 2019-10-11 NOTE — Progress Notes (Signed)
eLink Physician-Brief Progress Note Patient Name: Diane Flowers DOB: [birth year] MRN: [id number]   Date of Service  10/11/2019  HPI/Events of Note  Notified of agitation and pulling off mask with subsequent desaturation  eICU Interventions  Ordered to start Precedex. Get CXR wo monitor pneumomediastinum while on NIPPV     Intervention Category Minor Interventions: Agitation / anxiety - evaluation and management  Judd Lien 10/11/2019, 2:52 AM

## 2019-10-15 NOTE — Progress Notes (Signed)
80mL of Morphine wasted in the sink with Rayfield Citizen, RN.

## 2019-10-15 NOTE — Progress Notes (Signed)
Family called when heart rate started quickly declining. Patients heart rate stopped and death was confirmed at 0743. 2nd RN verify with Rayfield Citizen, RN.

## 2019-10-15 NOTE — Death Summary Note (Signed)
Death Summary  Diane Flowers TWS:[id number] DOB: 1970-03-11 DOA: October 10, 2019  PCP: Maggie Schwalbe, PA-C  Admit date: 10-Oct-2019 Date of Death: 2019/10/22 Time of Death: 7:43  am Notification: Maggie Schwalbe, PA-C notified of death of Oct 22, 2019   History of present illness:  Patient admitted to the hospital with a working diagnosis of acute hypoxic respiratory failure due to SARS COVID-19 viral pneumonia.  49 year old female with past medical history for myasthenia gravis, splenic artery aneurysm, thyroid cancer/hypothyroidism, prediabetes and history of pancreatic tumor who presented with fevers, chills, loss of taste and cough. She was diagnosed with SARS COVID-19 on 09/24/19.On 9/15 she developed hypoxemia down to 80s. She initially was seen at the outpatient infusion clinic, supposed to get monoclonal antibodies,butdue to hypoxemia she was referred to the hospital. On her initial physical examination she was febrile 101 F, tachycardic 101 bpm, tachypneic 25 breaths/min, her blood pressure was 79/62, 136/79, oxygenation 92% on 6 L per nasal cannula. Her lungs had no wheezing, heart S1-S2, present tachycardic, abdomen was soft, no lower extremity edema. Positive right leg tenderness. Her chest radiograph had diffuse, bilateral essential infiltrates, correlated with CT chest.  Patient was treated with systemic corticosteroids, remdesivir and baricitinib. She had worsening hypoxemia, and development of pneumomediastinum.  Very high elevated D-dimer, she has been placed on full anticoagulation. Ultrasonography of the lower extremities with acute deep vein thrombosis involving the right posterior tibial vein,and acute deep vein thrombosis involving the left peroneal veins.  Patient with worsening hypoxemia and increasing oxygen requirements, on 100% Fi02 heated high flow nasal cannula 70l/min and NRBM.   She has decided not to be intubated.  Follow up chest film withdiffuse  bilateral infiltrates more dense at the right lower and left upper lobe. She has developedpneumomediastinum and small apical right pneumothorax.Persistent cough and neck pain.  Patient has been placed on opiates analgesics for neck pain.  09/26 Patient with increased work of breathing and placed on non invasive mechanical ventilationNIV Pressure control.  Patient with severe agitation, trying to pull interface full mask, triggering severe oxygen desaturation and cyanosis. Started on Dexmedetomidne infusion for agitation.   She developed oxygen desaturation despite BIpap, with worsening agitation. Her family decided to change code status and continue care focusing in preventing any further suffering or pain.   Patient was placed on morphine drip to prevent any further suffering.    Bearl Mulberry did not improve after aggressive medical therapy and non invasive mechanical ventilation.   Final Diagnoses:  1.Acute hypoxic respiratory failure due to SARS COVID-19 viral pneumonia/severeARDS/ pneumomediastinum/ small right apical pneumothorax.  2. Bilateral acute lower extremity DVT.High pre test probability for PE 3. Myasthenia gravis. 4. Prediabetes and obesity class 3. 5. Hypothyroid.  6. Perineum pressure ulcer(unable to assess stage  The results of significant diagnostics from this hospitalization (including imaging, microbiology, ancillary and laboratory) are listed below for reference.    Significant Diagnostic Studies: CT Angio Chest PE W/Cm &/Or Wo Cm  Result Date: Oct 10, 2019 CLINICAL DATA:  COVID positive EXAM: CT ANGIOGRAPHY CHEST WITH CONTRAST TECHNIQUE: Multidetector CT imaging of the chest was performed using the standard protocol during bolus administration of intravenous contrast. Multiplanar CT image reconstructions and MIPs were obtained to evaluate the vascular anatomy. CONTRAST:  153mL OMNIPAQUE IOHEXOL 350 MG/ML SOLN COMPARISON:  None. FINDINGS:  Cardiovascular: Slightly suboptimal opacification is seen at the main pulmonary artery. However no central or proximal segmental pulmonary embolism is seen. The heart is normal in size. No pericardial effusion or thickening. No  evidence right heart strain. There is normal three-vessel brachiocephalic anatomy without proximal stenosis. The thoracic aorta is normal in appearance. Mediastinum/Nodes: Scattered small subcarinal and pretracheal lymph nodes are seen. Thyroid gland, trachea, and esophagus demonstrate no significant findings. Lungs/Pleura: Multifocal patchy airspace opacities are seen throughout both lungs predominantly the right lung base. No pleural effusion is seen. No pneumothorax. Upper Abdomen: No acute abnormalities present in the visualized portions of the upper abdomen. Musculoskeletal: No chest wall abnormality. No acute or significant osseous findings. Review of the MIP images confirms the above findings. IMPRESSION: Slightly suboptimal opacification of the main pulmonary artery, however no central or proximal segmental pulmonary embolism. Extensive multifocal patchy airspace opacities throughout both lungs consistent with multifocal pneumonia. Electronically Signed   By: Prudencio Pair M.D.   On: 10/13/2019 16:13   DG Chest Port 1 View  Result Date: 10/11/2019 CLINICAL DATA:  Pneumomediastinum EXAM: PORTABLE CHEST 1 VIEW COMPARISON:  10/10/2019 FINDINGS: Cardiac enlargement. Prominent consolidation in both lungs, progressing since previous study. Subcutaneous emphysema and pneumomediastinum are unchanged. IMPRESSION: Increasing consolidation in both lungs. Subcutaneous emphysema and pneumomediastinum are unchanged. Electronically Signed   By: Lucienne Capers M.D.   On: 10/11/2019 05:55   DG Chest Port 1 View  Result Date: 10/10/2019 CLINICAL DATA:  Hypoxia EXAM: PORTABLE CHEST 1 VIEW COMPARISON:  10/08/2019 FINDINGS: Shallow inspiration. Mild cardiac enlargement. Diffuse bilateral  pulmonary infiltrates consistent with multifocal pneumonia. Similar appearance to previous study. Persistent pneumomediastinum. Subcutaneous emphysema suggested in the right chest wall. IMPRESSION: Persistent bilateral pulmonary infiltrates consistent with multifocal pneumonia. Persistent pneumomediastinum and subcutaneous emphysema in the right chest wall. Electronically Signed   By: Lucienne Capers M.D.   On: 10/10/2019 02:23   DG Chest Port 1 View  Result Date: 10/08/2019 CLINICAL DATA:  Pneumomediastinum. EXAM: PORTABLE CHEST 1 VIEW COMPARISON:  10/07/2019. FINDINGS: EKG leads noted over the chest. Pneumomediastinum again noted. Stable cardiomegaly. Diffuse severe bilateral pulmonary infiltrates again noted without interim change. No pleural effusion. No pneumothorax noted on today's exam. Mild right chest subcutaneous emphysema again noted. IMPRESSION: 1. Pneumomediastinum again noted. No pneumothorax noted on today's exam. Mild right chest subcutaneous emphysema again noted. 2. Diffuse severe bilateral pulmonary infiltrates again noted without interim change. 3.  Stable cardiomegaly. Electronically Signed   By: Marcello Moores  Register   On: 10/08/2019 07:45   DG Chest Port 1 View  Result Date: 10/07/2019 CLINICAL DATA:  Reported COVID-19 positive. Hypoxia. Myasthenia gravis EXAM: PORTABLE CHEST 1 VIEW COMPARISON:  October 06, 2019 FINDINGS: There is now extensive pneumomediastinum with trace pneumothorax on the right. There is extensive soft tissue air throughout the chest superiorly. There is multifocal airspace opacity bilaterally, most notable in the left mid lung and both lower lung regions, essentially stable. Heart upper normal in size with pulmonary vascularity normal. No adenopathy. No bone lesions. IMPRESSION: There is now extensive pneumomediastinum and subcutaneous air with trace pneumothorax on the right. Multifocal airspace opacity remains.  Stable cardiac silhouette. These results will be  called to the ordering clinician or representative by the Radiologist Assistant, and communication documented in the PACS or [employer]. Electronically Signed   By: Lowella Grip III M.D.   On: 10/07/2019 10:00   DG CHEST PORT 1 VIEW  Result Date: 10/06/2019 CLINICAL DATA:  Respiratory failure, COVID pneumonia EXAM: PORTABLE CHEST 1 VIEW COMPARISON:  10/05/2019 FINDINGS: Pulmonary insufflation is stable. Superimposed extensive airspace infiltrate asymmetrically more severe throughout the right lung appears stable since prior examination. No pneumothorax or pleural effusion. Cardiac  size is within normal limits when accounting for rotation. IMPRESSION: Stable pulmonary infiltrates.  Stable pulmonary insufflation. Electronically Signed   By: Fidela Salisbury MD   On: 10/06/2019 07:07   DG CHEST PORT 1 VIEW  Result Date: 10/05/2019 CLINICAL DATA:  Acute respiratory distress EXAM: PORTABLE CHEST 1 VIEW COMPARISON:  October 04, 2019 FINDINGS: The cardiomediastinal silhouette is unchanged and enlarged in contour.There is new pneumomediastinum. No pleural effusion. No significant pneumothorax. Diffuse bilateral peripheral predominant heterogeneous opacities, consistent with the sequela of COVID-19 infection. These are increased in comparison to prior. Visualized abdomen is unremarkable. No acute osseous abnormality. IMPRESSION: 1. New pneumomediastinum. No significant pneumothorax. 2. Diffuse and worsened bilateral peripheral predominant heterogeneous opacities, consistent with the sequela of COVID-19 infection. Electronically Signed   By: Valentino Saxon MD   On: 10/05/2019 15:41   DG CHEST PORT 1 VIEW  Result Date: 10/04/2019 CLINICAL DATA:  Cough and shortness of breath.  COVID-19 positive EXAM: PORTABLE CHEST 1 VIEW COMPARISON:  Chest radiograph and chest CT September 30, 2019 FINDINGS: There is overall less airspace opacity bilaterally compared to recent studies. Airspace opacity does remain  in the mid and lower lung regions to a lesser degree. No new opacity evident. Heart is mildly enlarged with pulmonary vascularity normal. No adenopathy no bone lesions. IMPRESSION: Multifocal pneumonia remains although less prominent than on recent studies. Stable cardiac prominence. No adenopathy evident. Electronically Signed   By: Lowella Grip III M.D.   On: 10/04/2019 10:24   DG Chest Port 1 View  Result Date: 09/17/2019 CLINICAL DATA:  COVID, hypoxia, shortness of breath EXAM: PORTABLE CHEST 1 VIEW COMPARISON:  None. FINDINGS: Heart upper limits normal in size. Mediastinal contours within normal limits. Patchy bilateral airspace opacities. No effusions or pneumothorax. No acute bony abnormality. IMPRESSION: Patchy bilateral airspace disease compatible with COVID pneumonia. Electronically Signed   By: Rolm Baptise M.D.   On: 10/01/2019 12:33   VAS [place] LOWER EXTREMITY VENOUS (DVT)  Result Date: 10/05/2019  Lower Venous DVTStudy Indications: Swelling, Edema, and + Covid.  Performing Technologist: Griffin Basil RCT RDMS  Examination Guidelines: A complete evaluation includes B-mode imaging, spectral Doppler, color Doppler, and power Doppler as needed of all accessible portions of each vessel. Bilateral testing is considered an integral part of a complete examination. Limited examinations for reoccurring indications may be performed as noted. The reflux portion of the exam is performed with the patient in reverse Trendelenburg.  +---------+---------------+---------+-----------+---------------+--------------+ RIGHT    CompressibilityPhasicitySpontaneityProperties     Thrombus Aging +---------+---------------+---------+-----------+---------------+--------------+ CFV      Full           Yes      Yes                                      +---------+---------------+---------+-----------+---------------+--------------+ SFJ      Full                                                              +---------+---------------+---------+-----------+---------------+--------------+ FV Prox  Full                                                             +---------+---------------+---------+-----------+---------------+--------------+  FV Mid   Full                                                             +---------+---------------+---------+-----------+---------------+--------------+ FV DistalFull                                                             +---------+---------------+---------+-----------+---------------+--------------+ PFV      Full                                                             +---------+---------------+---------+-----------+---------------+--------------+ POP      Full           Yes      Yes                                      +---------+---------------+---------+-----------+---------------+--------------+ PTV      None                               softly         Acute                                                      echogenic                     +---------+---------------+---------+-----------+---------------+--------------+ PERO     Full                                                             +---------+---------------+---------+-----------+---------------+--------------+   +---------+---------------+---------+-----------+---------------+--------------+ LEFT     CompressibilityPhasicitySpontaneityProperties     Thrombus Aging +---------+---------------+---------+-----------+---------------+--------------+ CFV      Full           Yes      Yes                                      +---------+---------------+---------+-----------+---------------+--------------+ SFJ      Full                                                             +---------+---------------+---------+-----------+---------------+--------------+ FV Prox  Full                                                              +---------+---------------+---------+-----------+---------------+--------------+  FV Mid   Full                                                             +---------+---------------+---------+-----------+---------------+--------------+ FV DistalFull                                                             +---------+---------------+---------+-----------+---------------+--------------+ PFV      Full                                                             +---------+---------------+---------+-----------+---------------+--------------+ POP      Full           Yes      Yes                                      +---------+---------------+---------+-----------+---------------+--------------+ PTV      Full                                                             +---------+---------------+---------+-----------+---------------+--------------+ PERO     None                               softly         Acute                                                      echogenic                     +---------+---------------+---------+-----------+---------------+--------------+     Summary: RIGHT: - Findings consistent with acute deep vein thrombosis involving the right posterior tibial veins. - No cystic structure found in the popliteal fossa.  LEFT: - Findings consistent with acute deep vein thrombosis involving the left peroneal veins. - No cystic structure found in the popliteal fossa.  *See table(s) above for measurements and observations. Electronically signed by Deitra Mayo MD on 10/05/2019 at 4:47:57 PM.    Final    VAS [place] LOWER EXTREMITY VENOUS (DVT)  Result Date: 10/01/2019  Lower Venous DVTStudy Indications: Pain, and elevated ddimer.  Comparison Study: no prior Performing Technologist: Abram Sander RVS  Examination Guidelines: A complete evaluation includes B-mode imaging, spectral Doppler, color Doppler, and power Doppler as needed of all accessible  portions of each vessel. Bilateral testing is considered an integral part of a complete examination. Limited examinations for reoccurring indications may be  performed as noted. The reflux portion of the exam is performed with the patient in reverse Trendelenburg.  +---------+---------------+---------+-----------+----------+--------------+ RIGHT    CompressibilityPhasicitySpontaneityPropertiesThrombus Aging +---------+---------------+---------+-----------+----------+--------------+ CFV      Full           Yes      Yes                                 +---------+---------------+---------+-----------+----------+--------------+ SFJ      Full                                                        +---------+---------------+---------+-----------+----------+--------------+ FV Prox  Full                                                        +---------+---------------+---------+-----------+----------+--------------+ FV Mid   Full                                                        +---------+---------------+---------+-----------+----------+--------------+ FV DistalFull                                                        +---------+---------------+---------+-----------+----------+--------------+ PFV      Full                                                        +---------+---------------+---------+-----------+----------+--------------+ POP      Full           Yes      Yes                                 +---------+---------------+---------+-----------+----------+--------------+ PTV      Full                                                        +---------+---------------+---------+-----------+----------+--------------+ PERO     Full                                                        +---------+---------------+---------+-----------+----------+--------------+   +---------+---------------+---------+-----------+----------+--------------+ LEFT      CompressibilityPhasicitySpontaneityPropertiesThrombus Aging +---------+---------------+---------+-----------+----------+--------------+ CFV      Full           Yes  Yes                                 +---------+---------------+---------+-----------+----------+--------------+ SFJ      Full                                                        +---------+---------------+---------+-----------+----------+--------------+ FV Prox  Full                                                        +---------+---------------+---------+-----------+----------+--------------+ FV Mid   Full                                                        +---------+---------------+---------+-----------+----------+--------------+ FV DistalFull                                                        +---------+---------------+---------+-----------+----------+--------------+ PFV      Full                                                        +---------+---------------+---------+-----------+----------+--------------+ POP      Full           Yes      Yes                                 +---------+---------------+---------+-----------+----------+--------------+ PTV      Full                                                        +---------+---------------+---------+-----------+----------+--------------+ PERO                                                  Not visualized +---------+---------------+---------+-----------+----------+--------------+     Summary: BILATERAL: - No evidence of deep vein thrombosis seen in the lower extremities, bilaterally. - No evidence of superficial venous thrombosis in the lower extremities, bilaterally. -   *See table(s) above for measurements and observations. Electronically signed by Servando Snare MD on 10/01/2019 at 9:21:46 AM.    Final    ECHOCARDIOGRAM LIMITED  Result Date: 10/05/2019    ECHOCARDIOGRAM LIMITED REPORT   Patient Name:    JOSHUA ZERINGUE Date of Exam: 10/05/2019 Medical Rec #:  [id number]    Height:       72.0 in Accession #:    [id number]   Weight:       298.1 lb Date of Birth:  02-26-1970    BSA:          2.524 m Patient Age:    56 years     BP:           133/80 mmHg Patient Gender: F            HR:           64 bpm. Exam Location:  Inpatient Procedure: Limited Echo, Cardiac Doppler and Color Doppler Indications:    Pulmonary embolus  History:        Patient has no prior history of Echocardiogram examinations.                 Signs/Symptoms:Shortness of Breath; Risk Factors:Obesity. COVID                 +.  Sonographer:    Dustin Flock Referring Phys: [id number] [place]  1. Left ventricular ejection fraction, by estimation, is 60 to 65%. The left ventricle has normal function. The left ventricle has no regional wall motion abnormalities. Left ventricular diastolic parameters are consistent with Grade I diastolic dysfunction (impaired relaxation).  2. Right ventricular systolic function is normal. The right ventricular size is normal. Tricuspid regurgitation signal is inadequate for assessing PA pressure.  3. The mitral valve is normal in structure. No evidence of mitral valve regurgitation. No evidence of mitral stenosis.  4. The aortic valve is normal in structure. Aortic valve regurgitation is not visualized. No aortic stenosis is present.  5. The inferior vena cava is normal in size with greater than 50% respiratory variability, suggesting right atrial pressure of 3 mmHg. FINDINGS  Left Ventricle: Left ventricular ejection fraction, by estimation, is 60 to 65%. The left ventricle has normal function. The left ventricle has no regional wall motion abnormalities. The left ventricular internal cavity size was normal in size. There is  no left ventricular hypertrophy. Left ventricular diastolic parameters are consistent with Grade I diastolic dysfunction (impaired relaxation). Right Ventricle: The right ventricular  size is normal. No increase in right ventricular wall thickness. Right ventricular systolic function is normal. Tricuspid regurgitation signal is inadequate for assessing PA pressure. Left Atrium: Left atrial size was normal in size. Right Atrium: Right atrial size was normal in size. Pericardium: There is no evidence of pericardial effusion. Presence of pericardial fat pad. Mitral Valve: The mitral valve is normal in structure. No evidence of mitral valve stenosis. Tricuspid Valve: The tricuspid valve is normal in structure. Tricuspid valve regurgitation is not demonstrated. No evidence of tricuspid stenosis. Aortic Valve: The aortic valve is normal in structure. Aortic valve regurgitation is not visualized. No aortic stenosis is present. Pulmonic Valve: The pulmonic valve was normal in structure. Pulmonic valve regurgitation is not visualized. No evidence of pulmonic stenosis. Aorta: The aortic root is normal in size and structure. Venous: The inferior vena cava is normal in size with greater than 50% respiratory variability, suggesting right atrial pressure of 3 mmHg. IAS/Shunts: No atrial level shunt detected by color flow Doppler. LEFT VENTRICLE PLAX 2D LVIDd:         4.70 cm  Diastology LVIDs:         3.30 cm  LV e' medial:    7.18 cm/s LV PW:         1.30  cm  LV E/e' medial:  7.5 LV IVS:        1.30 cm  LV e' lateral:   6.09 cm/s LVOT diam:     2.50 cm  LV E/e' lateral: 8.9 LV SV:         62 LV SV Index:   25 LVOT Area:     4.91 cm  RIGHT VENTRICLE RV S prime:     7.18 cm/s LEFT ATRIUM         Index LA diam:    3.00 cm 1.19 cm/m  AORTIC VALVE LVOT Vmax:   71.10 cm/s LVOT Vmean:  49.100 cm/s LVOT VTI:    0.127 m  AORTA Ao Root diam: 2.80 cm MITRAL VALVE MV Area (PHT): 6.12 cm    SHUNTS MV Decel Time: 124 msec    Systemic VTI:  0.13 m MV E velocity: 54.00 cm/s  Systemic Diam: 2.50 cm MV A velocity: 75.00 cm/s MV E/A ratio:  0.72 Mihai Croitoru MD Electronically signed by Sanda Klein MD Signature  Date/Time: 10/05/2019/2:29:47 PM    Final     Microbiology: Recent Results (from the past 240 hour(s))  MRSA PCR Screening     Status: None   Collection Time: 10/11/19  9:06 AM   Specimen: Nasal Mucosa; Nasopharyngeal  Result Value Ref Range Status   MRSA by PCR NEGATIVE NEGATIVE Final    Comment:        The GeneXpert MRSA Assay (FDA approved for NASAL specimens only), is one component of a comprehensive MRSA colonization surveillance program. It is not intended to diagnose MRSA infection nor to guide or monitor treatment for MRSA infections. Performed at [facility], [place] [address] [place], [place] [address]      Labs: Basic Metabolic Panel: Recent Labs  Lab 10/06/19 [phone] 10/06/19 0962 10/07/19 8366 10/07/19 0243 10/08/19 0321 10/08/19 0321 10/09/19 0242 10/09/19 0242 10/10/19 0354 10/11/19 0259  NA 134*   < > 136  --  134*  --  132*  --  137 139  K 4.4   < > 4.2   < > 4.2   < > 4.0   < > 4.7 4.8  CL 99   < > 96*  --  96*  --  95*  --  95* 98  CO2 27   < > 29  --  28  --  26  --  30 27  GLUCOSE 186*   < > 170*  --  173*  --  173*  --  144* 180*  BUN 18   < > 20  --  17  --  18  --  16 18  CREATININE 0.75   < > 0.69  --  0.57  --  0.71  --  0.70 0.92  CALCIUM 7.5*   < > 7.9*  --  8.3*  --  8.3*  --  8.3* 8.6*  MG 2.5*  --   --   --   --   --   --   --   --   --    < > = values in this interval not displayed.   Liver Function Tests: Recent Labs  Lab 10/07/19 0243 10/08/19 0321 10/09/19 0242 10/10/19 0354 10/11/19 0259  AST 42* 44* 34 32 29  ALT 47* 57* 50* 43 39  ALKPHOS 63 58 59 58 62  BILITOT 1.2 1.3* 1.5* 1.2 1.2  PROT 6.2* 6.1* 6.2* 6.3* 6.6  ALBUMIN 2.8* 2.5*  2.7* 2.6* 2.6*   No results for input(s): LIPASE, AMYLASE in the last 168 hours. No results for input(s): AMMONIA in the last 168 hours. CBC: Recent Labs  Lab 10/06/19 0238 10/08/19 0321  WBC 16.2* 10.8*  NEUTROABS 15.3* 10.1*  HGB 13.2 11.7*  HCT 40.5 36.7  MCV  86.2 86.4  PLT 273 307   Cardiac Enzymes: No results for input(s): CKTOTAL, CKMB, CKMBINDEX, TROPONINI in the last 168 hours. D-Dimer Recent Labs    10/10/19 0354 10/11/19 0259  DDIMER 3.65* 4.86*   BNP: Invalid input(s): POCBNP CBG: Recent Labs  Lab 10/10/19 1221 10/10/19 1751 10/10/19 2152 10/11/19 0759 10/11/19 1144  GLUCAP 123* 120* 127* 172* 143*   Anemia work up Recent Labs    10/10/19 0354 10/11/19 0259  FERRITIN 474* 525*   Urinalysis    Component Value Date/Time   COLORURINE AMBER (A) 10/10/2019 1323   APPEARANCEUR HAZY (A) 10/10/2019 1323   LABSPEC 1.024 10/10/2019 1323   PHURINE 6.0 10/10/2019 1323   GLUCOSEU NEGATIVE 10/10/2019 1323   [place] 10/10/2019 1323   BILIRUBINUR NEGATIVE 10/10/2019 1323   [place] 10/10/2019 1323   PROTEINUR 30 (A) 10/10/2019 1323   NITRITE NEGATIVE 10/10/2019 1323   LEUKOCYTESUR NEGATIVE 10/10/2019 1323   Sepsis Labs Invalid input(s): PROCALCITONIN,  WBC,  LACTICIDVEN     SIGNED:  Tawni Millers, MD  [facility] October 25, 2019, 8:23 AM Pager   If 7PM-7AM, please contact night-coverage [email or web address] Password [email or web address]

## 2019-10-15 DEATH — deceased

## 2019-10-26 DIAGNOSIS — R0902 Hypoxemia: Secondary | ICD-10-CM

## 2019-10-26 DIAGNOSIS — J9601 Acute respiratory failure with hypoxia: Secondary | ICD-10-CM

## 2019-10-26 DIAGNOSIS — J982 Interstitial emphysema: Secondary | ICD-10-CM

## 2019-10-26 DIAGNOSIS — U071 COVID-19: Secondary | ICD-10-CM

## 2022-04-13 IMAGING — DX DG CHEST 1V PORT
1 series · 1 of 1 positions shown · non-contrast
Comparison: None.

CLINICAL DATA: COVID, hypoxia, shortness of breath

EXAM:
PORTABLE CHEST 1 VIEW

[chest ap]
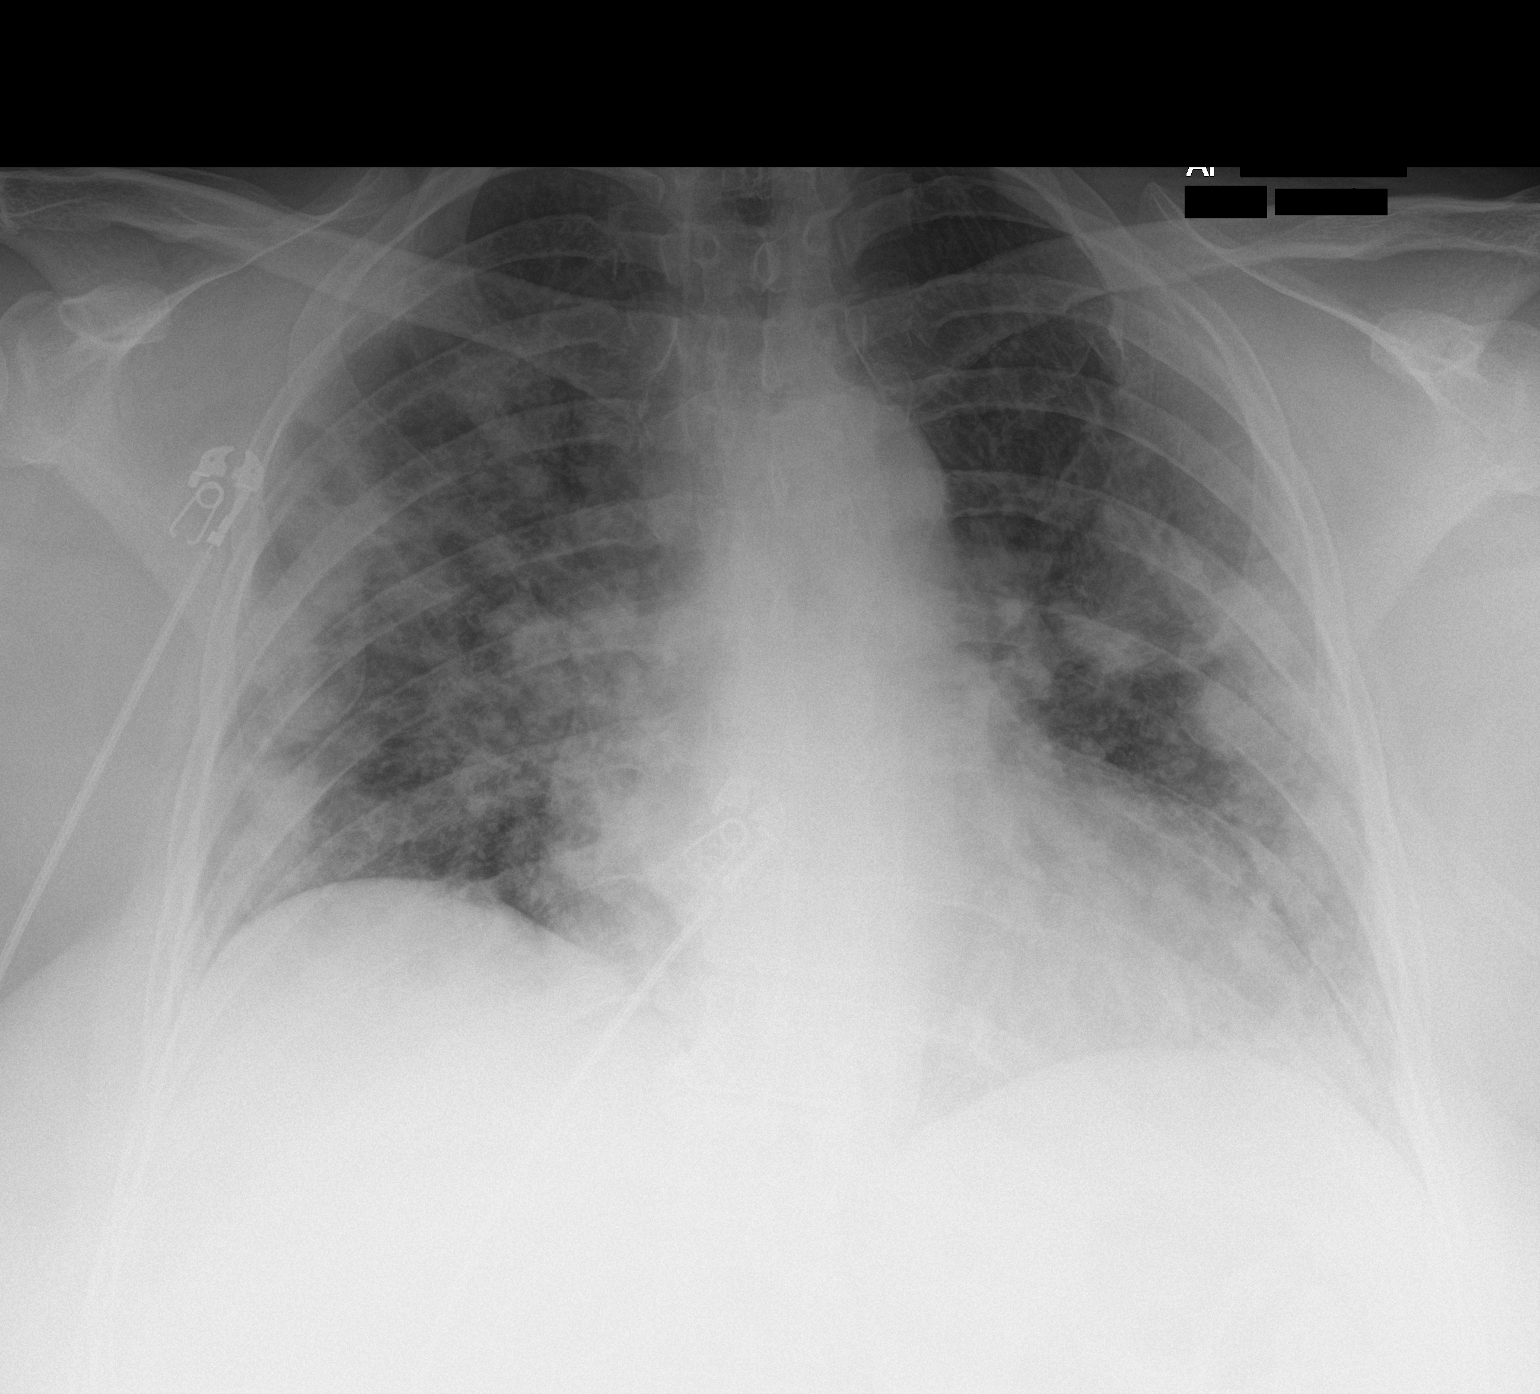

[1 of 1 positions shown; findings below may reference images not displayed]

FINDINGS: Heart upper limits normal in size. Mediastinal contours within
normal limits. Patchy bilateral airspace opacities. No effusions or
pneumothorax. No acute bony abnormality.
IMPRESSION: Patchy bilateral airspace disease compatible with COVID pneumonia.

## 2022-04-23 IMAGING — DX DG CHEST 1V PORT
1 series · 1 of 1 positions shown · non-contrast
Comparison: 10/08/2019

CLINICAL DATA: Hypoxia

EXAM:
PORTABLE CHEST 1 VIEW

[chest ap]
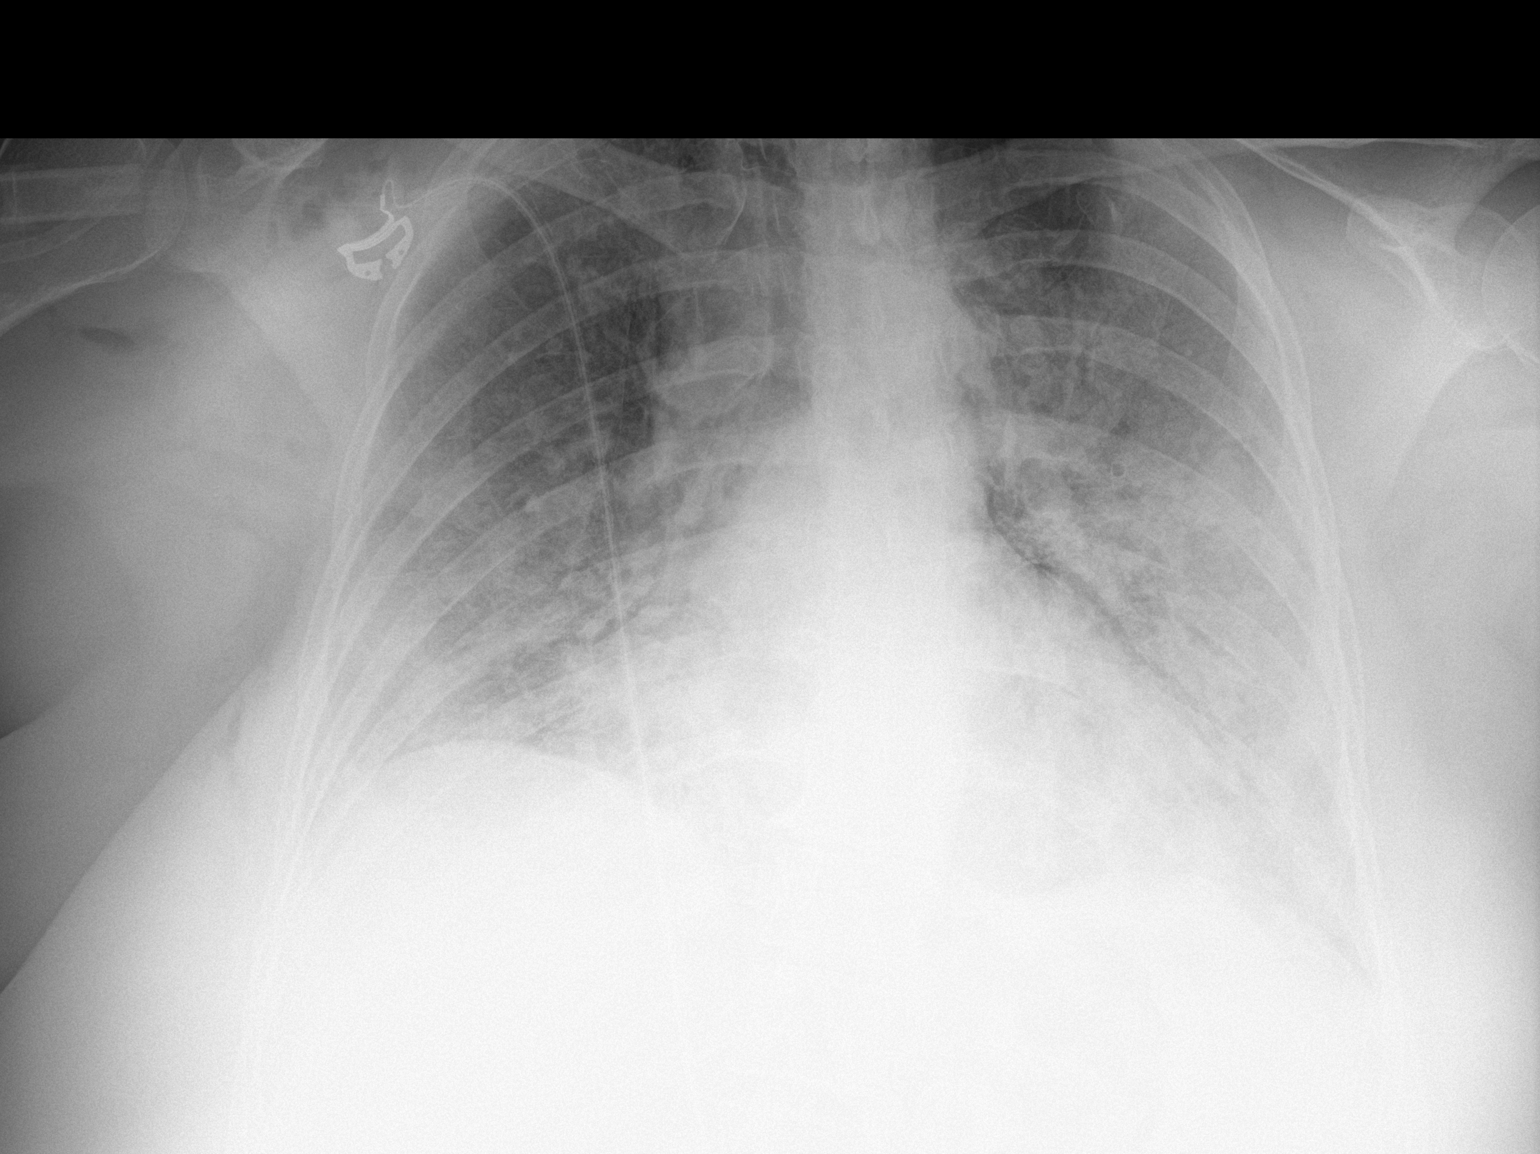

[1 of 1 positions shown; findings below may reference images not displayed]

FINDINGS: Shallow inspiration. Mild cardiac enlargement. Diffuse bilateral
pulmonary infiltrates consistent with multifocal pneumonia. Similar
appearance to previous study. Persistent pneumomediastinum.
Subcutaneous emphysema suggested in the right chest wall.
IMPRESSION: Persistent bilateral pulmonary infiltrates consistent with
multifocal pneumonia. Persistent pneumomediastinum and subcutaneous
emphysema in the right chest wall.
